# Patient Record
Sex: Female | Born: 1990 | Race: Black or African American | Hispanic: No | Marital: Single | Smoking: Former smoker
Health system: Southern US, Community
[De-identification: ages and names within clinical notes are randomized; demographics above are authoritative.]

## PROBLEM LIST (undated history)

## (undated) DIAGNOSIS — I10 Essential (primary) hypertension: Secondary | ICD-10-CM

## (undated) HISTORY — PX: NO PAST SURGERIES: SHX2092

---

## 2010-04-29 ENCOUNTER — Emergency Department: Payer: Self-pay | Admitting: Unknown Physician Specialty

## 2010-11-22 ENCOUNTER — Observation Stay: Payer: Self-pay | Admitting: Obstetrics and Gynecology

## 2010-11-24 ENCOUNTER — Observation Stay: Payer: Self-pay

## 2010-11-26 ENCOUNTER — Inpatient Hospital Stay: Payer: Self-pay

## 2012-01-24 ENCOUNTER — Emergency Department: Payer: Self-pay | Admitting: *Deleted

## 2012-01-24 LAB — COMPREHENSIVE METABOLIC PANEL
Albumin: 4.2 g/dL (ref 3.4–5.0)
Co2: 24 mmol/L (ref 21–32)
Creatinine: 0.67 mg/dL (ref 0.60–1.30)
EGFR (Non-African Amer.): >60
Glucose: 95 mg/dL (ref 65–99)
SGOT(AST): 35 U/L (ref 15–37)

## 2012-01-24 LAB — CBC
HGB: 13.7 g/dL (ref 12.0–16.0)
MCHC: 33.9 g/dL (ref 32.0–36.0)
MCV: 94 fL (ref 80–100)
RBC: 4.31 10*6/uL (ref 3.80–5.20)

## 2012-01-24 LAB — URINALYSIS, COMPLETE
Bilirubin,UR: NEGATIVE
Blood: NEGATIVE
Nitrite: NEGATIVE
RBC,UR: 4 /HPF (ref 0–5)
Specific Gravity: 1.028 (ref 1.003–1.030)
Squamous Epithelial: 7
WBC UR: 3 /HPF (ref 0–5)

## 2012-01-24 LAB — LIPASE, BLOOD: Lipase: 110 U/L (ref 73–393)

## 2012-05-07 ENCOUNTER — Emergency Department: Payer: Self-pay | Admitting: *Deleted

## 2012-06-04 ENCOUNTER — Emergency Department: Payer: Self-pay | Admitting: Emergency Medicine

## 2012-06-08 LAB — BETA STREP CULTURE(ARMC)

## 2013-05-20 ENCOUNTER — Inpatient Hospital Stay (HOSPITAL_COMMUNITY)
Admission: AD | Admit: 2013-05-20 | Discharge: 2013-05-20 | Discharge status: Against Medical Advice | Payer: Self-pay | Source: Ambulatory Visit | Attending: Obstetrics and Gynecology | Admitting: Obstetrics and Gynecology

## 2013-05-20 NOTE — MAU Note (Signed)
Pt not in Lobby. 

## 2013-05-20 NOTE — MAU Note (Signed)
Pt not in lobby.  

## 2013-05-21 ENCOUNTER — Emergency Department: Payer: Self-pay | Admitting: Emergency Medicine

## 2013-05-21 LAB — URINALYSIS, COMPLETE
Bilirubin,UR: NEGATIVE
Ketone: NEGATIVE
Leukocyte Esterase: NEGATIVE
Ph: 5 (ref 4.5–8.0)
Protein: NEGATIVE
RBC,UR: 150 /HPF (ref 0–5)
Squamous Epithelial: 1
WBC UR: NONE SEEN /HPF (ref 0–5)

## 2013-05-21 LAB — CBC
HCT: 36.4 % (ref 35.0–47.0)
HGB: 12.3 g/dL (ref 12.0–16.0)
MCH: 31.2 pg (ref 26.0–34.0)
MCV: 92 fL (ref 80–100)
RBC: 3.96 10*6/uL (ref 3.80–5.20)
RDW: 13.8 % (ref 11.5–14.5)
WBC: 3.5 10*3/uL — ABNORMAL LOW (ref 3.6–11.0)

## 2013-05-21 LAB — COMPREHENSIVE METABOLIC PANEL
Albumin: 3.3 g/dL — ABNORMAL LOW (ref 3.4–5.0)
BUN: 12 mg/dL (ref 7–18)
Calcium, Total: 8.7 mg/dL (ref 8.5–10.1)
Chloride: 110 mmol/L — ABNORMAL HIGH (ref 98–107)
Co2: 25 mmol/L (ref 21–32)
Creatinine: 0.63 mg/dL (ref 0.60–1.30)
EGFR (African American): >60
EGFR (Non-African Amer.): >60
Osmolality: 275 (ref 275–301)
Potassium: 4 mmol/L (ref 3.5–5.1)
SGOT(AST): 19 U/L (ref 15–37)
SGPT (ALT): 20 U/L (ref 12–78)
Sodium: 138 mmol/L (ref 136–145)

## 2013-05-21 LAB — HCG, QUANTITATIVE, PREGNANCY: Beta Hcg, Quant.: <1 m[IU]/mL — ABNORMAL LOW

## 2013-05-21 LAB — GC/CHLAMYDIA PROBE AMP

## 2013-06-01 IMAGING — CT CT HEAD WITHOUT CONTRAST
2 series · 16 of 30 positions shown, 20 images · non-contrast
Comparison: none

REASON FOR EXAM: worst HA of life
COMMENTS:   LMP: One week ago

[Series 2: without · axial · non-contrast · 0.42mm/px · z∈[+306,+430]mm · 13 of 31 slices shown, 17 images]
[im 3/31  brain]
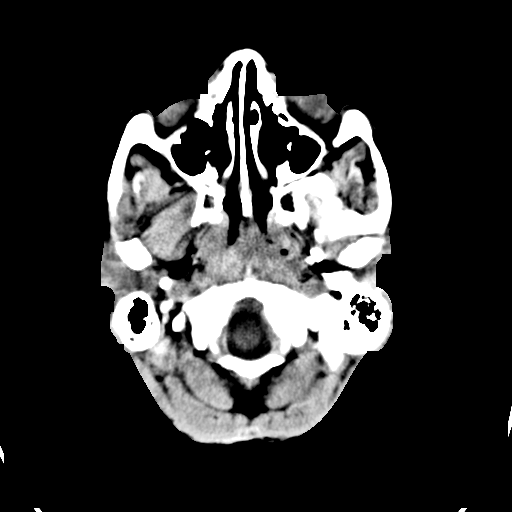
[im 3/31  bone]
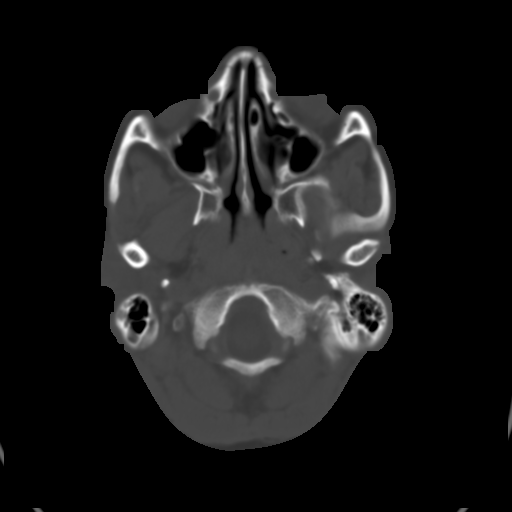
[im 5/31  brain]
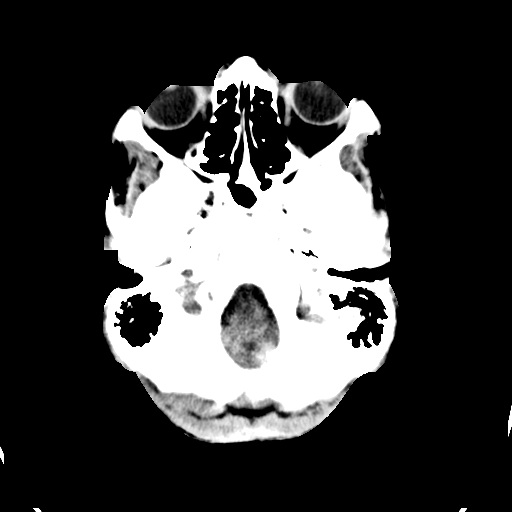
[im 7/31  brain]
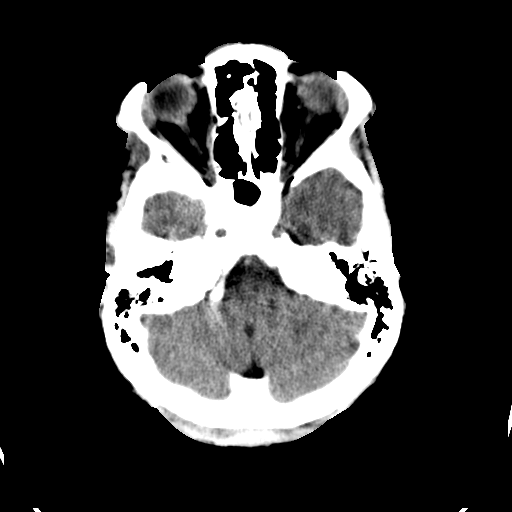
[im 9/31  brain]
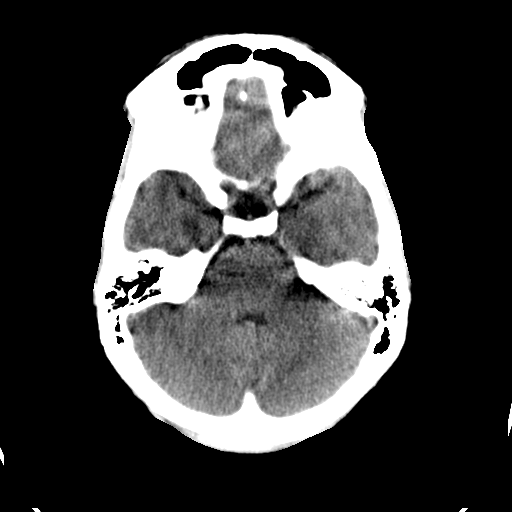
[im 11/31  brain]
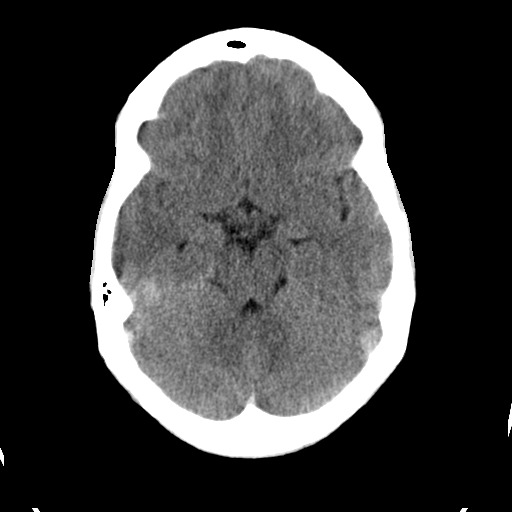
[im 11/31  bone]
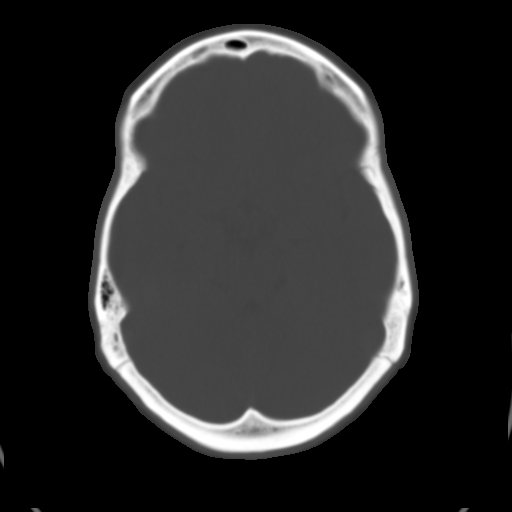
[im 13/31  brain]
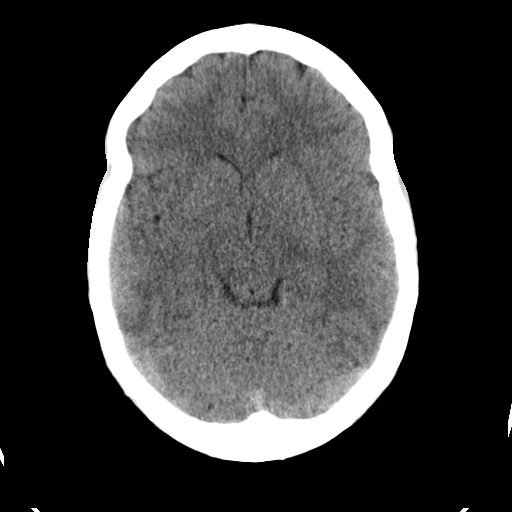
[im 16/31  brain]
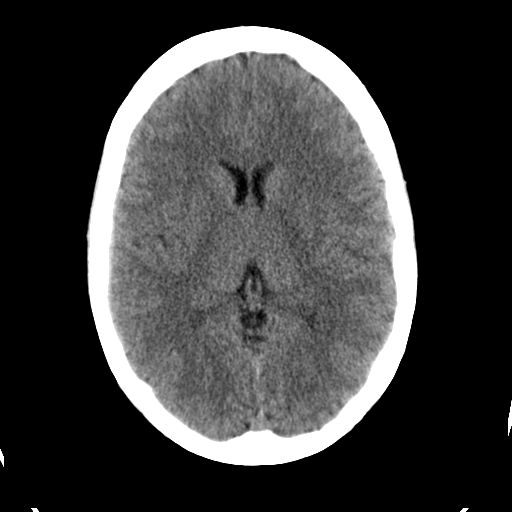
[im 18/31  brain]
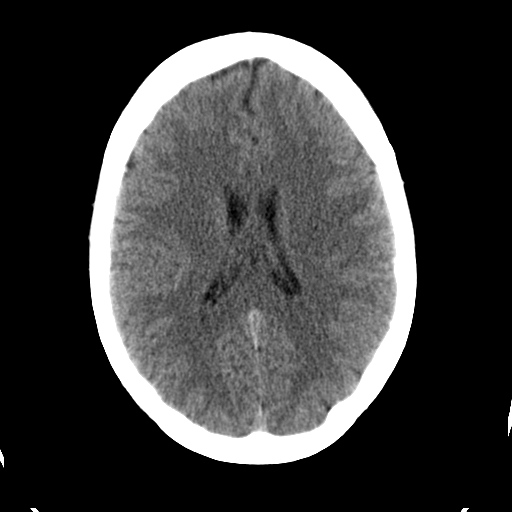
[im 20/31  brain]
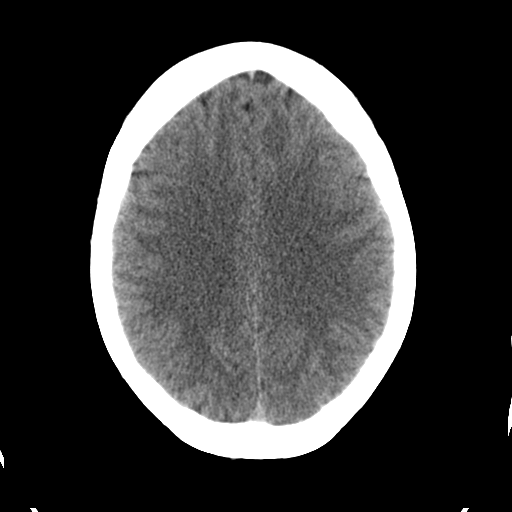
[im 20/31  bone]
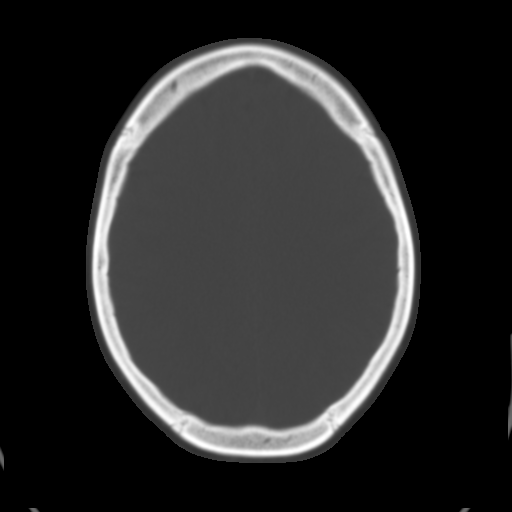
[im 22/31  brain]
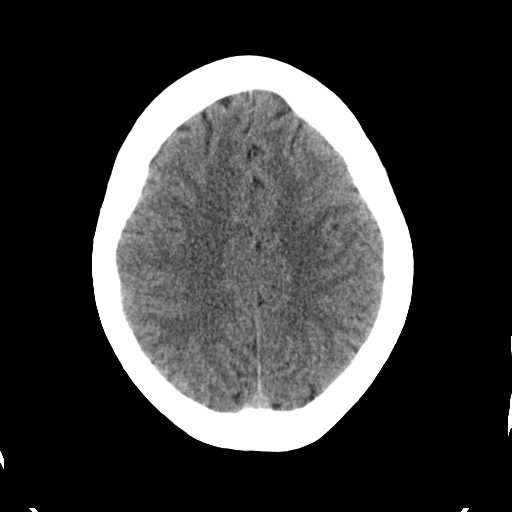
[im 24/31  brain]
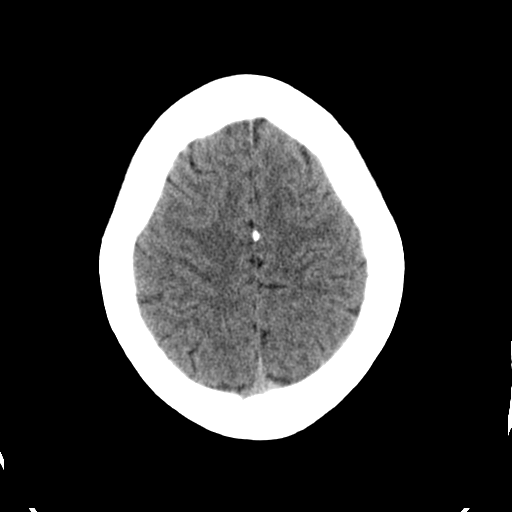
[im 26/31  brain]
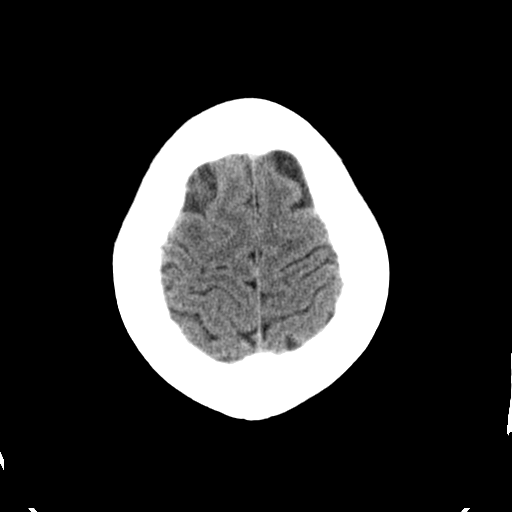
[im 28/31  brain]
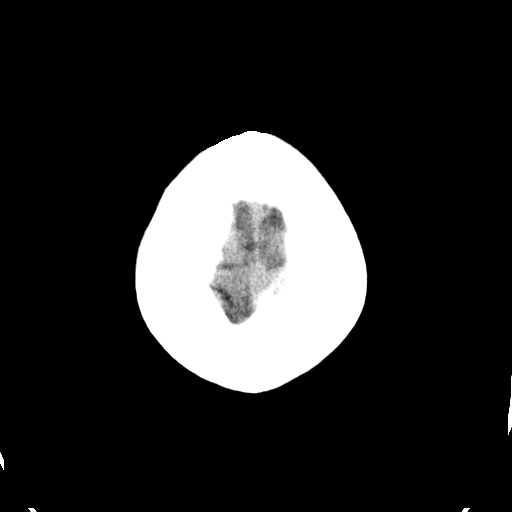
[im 28/31  bone]
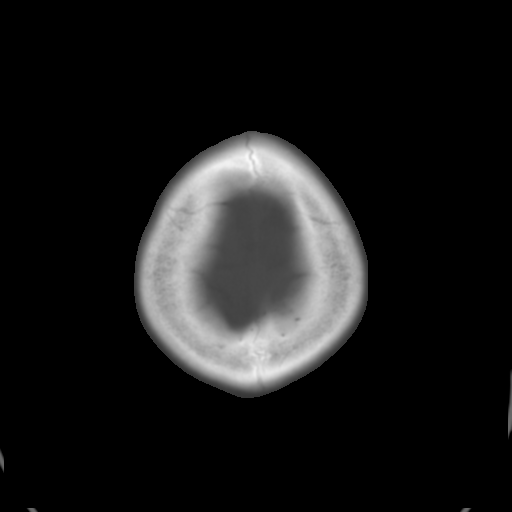

[Series 3: bone · axial · 0.42mm/px · z∈[+306,+346]mm · 3 of 31 slices shown]
[im 3/31  bone]
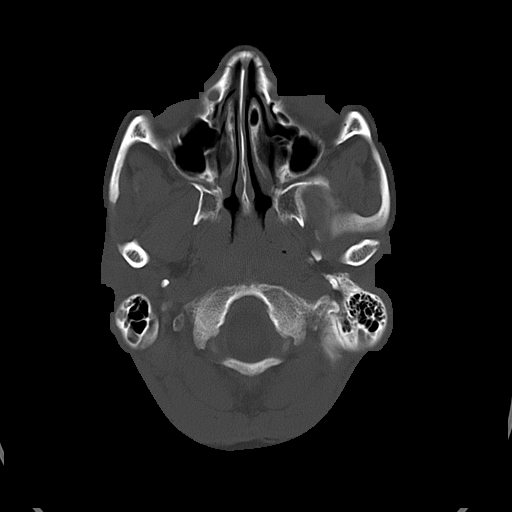
[im 7/31  bone]
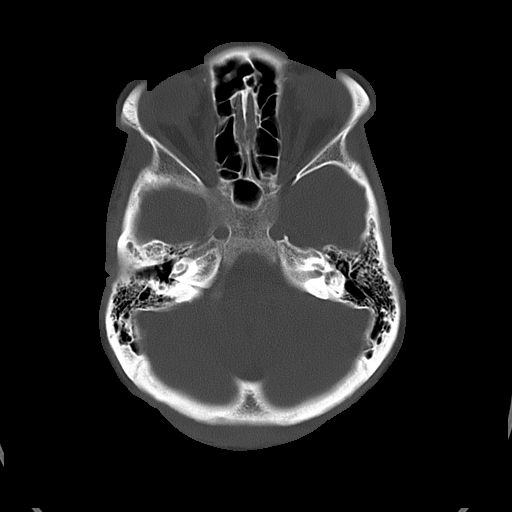
[im 11/31  bone]
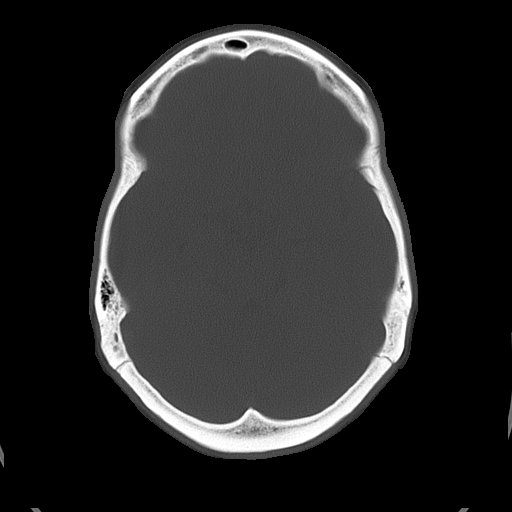

[16 of 30 positions shown; findings below may reference images not displayed]

PROCEDURE:     CT  - CT HEAD WITHOUT CONTRAST  - June 04, 2012  [DATE]

RESULT:     Axial CT scanning was performed through the brain with
reconstructions at 5 mm intervals and slice thicknesses.

The ventricles are normal in size and position. There is no intracranial
hemorrhage nor intracranial mass effect. The cerebellum and brainstem are
normal in density. There are no abnormal intracranial calcifications. There
is no evidence of an acute ischemic or hemorrhagic event. At bone window
settings there is no evidence of an acute skull fracture.
IMPRESSION: There is no acute intracranial abnormality.

A preliminary report was sent to the [HOSPITAL] the conclusion
of the study.

## 2013-11-15 ENCOUNTER — Emergency Department: Payer: Self-pay | Admitting: Emergency Medicine

## 2013-11-15 LAB — CBC
HCT: 39.6 % (ref 35.0–47.0)
HGB: 13.4 g/dL (ref 12.0–16.0)
MCH: 31.6 pg (ref 26.0–34.0)
MCHC: 33.9 g/dL (ref 32.0–36.0)
MCV: 93 fL (ref 80–100)
Platelet: 270 10*3/uL (ref 150–440)
RBC: 4.25 10*6/uL (ref 3.80–5.20)
RDW: 13.3 % (ref 11.5–14.5)
WBC: 5.7 10*3/uL (ref 3.6–11.0)

## 2013-11-15 LAB — COMPREHENSIVE METABOLIC PANEL
ANION GAP: 6 — AB (ref 7–16)
Albumin: 3.7 g/dL (ref 3.4–5.0)
Alkaline Phosphatase: 107 U/L
BUN: 11 mg/dL (ref 7–18)
Bilirubin,Total: 0.9 mg/dL (ref 0.2–1.0)
CREATININE: 0.62 mg/dL (ref 0.60–1.30)
Calcium, Total: 9.1 mg/dL (ref 8.5–10.1)
Chloride: 109 mmol/L — ABNORMAL HIGH (ref 98–107)
Co2: 23 mmol/L (ref 21–32)
EGFR (African American): >60
GLUCOSE: 92 mg/dL (ref 65–99)
OSMOLALITY: 275 (ref 275–301)
Potassium: 3.9 mmol/L (ref 3.5–5.1)
SGOT(AST): 20 U/L (ref 15–37)
SGPT (ALT): 31 U/L (ref 12–78)
Sodium: 138 mmol/L (ref 136–145)
Total Protein: 7.6 g/dL (ref 6.4–8.2)

## 2013-11-15 LAB — URINALYSIS, COMPLETE
BILIRUBIN, UR: NEGATIVE
Bacteria: NONE SEEN
GLUCOSE, UR: NEGATIVE mg/dL (ref 0–75)
Ketone: NEGATIVE
LEUKOCYTE ESTERASE: NEGATIVE
Nitrite: NEGATIVE
Ph: 5 (ref 4.5–8.0)
Protein: NEGATIVE
RBC,UR: 4 /HPF (ref 0–5)
Specific Gravity: 1.023 (ref 1.003–1.030)
Squamous Epithelial: 4
WBC UR: 2 /HPF (ref 0–5)

## 2013-11-15 LAB — PREGNANCY, URINE: Pregnancy Test, Urine: NEGATIVE m[IU]/mL

## 2013-11-15 LAB — LIPASE, BLOOD: Lipase: 91 U/L (ref 73–393)

## 2014-03-09 ENCOUNTER — Emergency Department: Payer: Self-pay | Admitting: Emergency Medicine

## 2017-10-16 ENCOUNTER — Emergency Department
Admission: EM | Admit: 2017-10-16 | Discharge: 2017-10-16 | Disposition: A | Discharge status: Home / Self Care | Payer: Self-pay | Attending: Emergency Medicine | Admitting: Emergency Medicine

## 2017-10-16 ENCOUNTER — Encounter: Payer: Self-pay | Admitting: Emergency Medicine

## 2017-10-16 DIAGNOSIS — L02412 Cutaneous abscess of left axilla: Secondary | ICD-10-CM | POA: Insufficient documentation

## 2017-10-16 DIAGNOSIS — F17200 Nicotine dependence, unspecified, uncomplicated: Secondary | ICD-10-CM | POA: Insufficient documentation

## 2017-10-16 MED ORDER — LIDOCAINE HCL (PF) 1 % IJ SOLN: 5.0000 mL | Freq: Once | INTRAMUSCULAR | Status: DC

## 2017-10-16 MED ORDER — HYDROCODONE-ACETAMINOPHEN 5-325 MG PO TABS: 1.0000 | ORAL_TABLET | Freq: Four times a day (QID) | ORAL | 0 refills | Status: DC | PRN

## 2017-10-16 MED ORDER — SULFAMETHOXAZOLE-TRIMETHOPRIM 800-160 MG PO TABS: 1.0000 | ORAL_TABLET | Freq: Two times a day (BID) | ORAL | 0 refills | Status: DC

## 2017-10-16 NOTE — ED Provider Notes (Signed)
[facility] Emergency Department Provider Note  ____________________________________________   First MD Initiated Contact with Patient 10/16/17 1005     (approximate)  I have reviewed the triage vital signs and the nursing notes.   HISTORY  Chief Complaint Abscess    HPI Terri Piedratasia T Albertsen is a 26 y.o. female complains of having a swollen red area on the left axilla, she denies fever or chills, she states it has been there for 2 days, she states it got a lot "bigger "overnight, she has never had a abscess before  History reviewed. No pertinent past medical history.  There are no active problems to display for this patient.   History reviewed. No pertinent surgical history.  Prior to Admission medications   Medication Sig Start Date End Date Taking? Authorizing Provider  HYDROcodone-acetaminophen (NORCO/VICODIN) 5-325 MG tablet Take 1 tablet by mouth every 6 (six) hours as needed for moderate pain. 10/16/17   Tiffanyann Deroo, Roselyn BeringSusan W, PA-C  sulfamethoxazole-trimethoprim (BACTRIM DS,SEPTRA DS) 800-160 MG tablet Take 1 tablet by mouth 2 (two) times daily. 10/16/17   Faythe GheeFisher, Arrow Tomko W, PA-C    Allergies Patient has no known allergies.  No family history on file.  Social History Social History   Tobacco Use  . Smoking status: Light Tobacco Smoker  . Smokeless tobacco: Never Used  Substance Use Topics  . Alcohol use: Not on file  . Drug use: No    Review of Systems  Constitutional: No fever/chills Eyes: No visual changes. ENT: No sore throat. Respiratory: Denies cough Genitourinary: Negative for dysuria. Musculoskeletal: Negative for back pain. Skin: Positive for red swollen area    ____________________________________________   PHYSICAL EXAM:  VITAL SIGNS: ED Triage Vitals  Enc Vitals Group     BP 10/16/17 0952 (!) 177/77     Pulse Rate 10/16/17 0952 85     Resp 10/16/17 0952 20     Temp 10/16/17 0952 98.8 F (37.1 C)     Temp Source  10/16/17 0952 Oral     SpO2 10/16/17 0952 97 %     Weight 10/16/17 0952 165 lb (74.8 kg)     Height 10/16/17 0952 5\' 5"  (1.651 m)     Head Circumference --      Peak Flow --      Pain Score 10/16/17 0949 6     Pain Loc --      Pain Edu? --      Excl. in GC? --     Constitutional: Alert and oriented. Well appearing and in no acute distress. Eyes: Conjunctivae are normal.  Head: Atraumatic. Nose: No congestion/rhinnorhea. Mouth/Throat: Mucous membranes are moist.   Cardiovascular: Normal rate, regular rhythm.  Heart sounds are normal Respiratory: Normal respiratory effort.  No retractions, lungs are clear to auscultation GU: deferred Musculoskeletal: FROM all extremities, warm and well perfused Neurologic:  Normal speech and language.  Skin:  Skin is warm, dry and intact.  Positive for a large red swollen fluctuant area in the left upper axilla Psychiatric: Mood and affect are normal. Speech and behavior are normal.  ____________________________________________   LABS (all labs ordered are listed, but only abnormal results are displayed)  Labs Reviewed - No data to display ____________________________________________   ____________________________________________  RADIOLOGY    ____________________________________________   PROCEDURES  Procedure(s) performed: I&D of the abscess Area was cleaned with Betadine, 1% Xylocaine was used as a local anesthetic, #11 blade used for incision, a large amount of pus was expelled, the area  was irrigated and then packed with iodoform, a dressing was applied, patient tolerated procedure well      ____________________________________________   INITIAL IMPRESSION / ASSESSMENT AND PLAN / ED COURSE  Pertinent labs & imaging results that were available during my care of the patient were reviewed by me and considered in my medical decision making (see chart for details).  Patient is a 26 year old female with complaints of a left  axillary abscess, she is afebrile, the area was I&D, she is to return to the emergency room in 2 days for a recheck and packing removal, she was given a prescription for Septra DS 1 twice daily for 7 days, Norco 5/325, patient was discharged in stable condition, she states she will comply with treatment plan      ____________________________________________   FINAL CLINICAL IMPRESSION(S) / ED DIAGNOSES  Final diagnoses:  Abscess of left axilla      NEW MEDICATIONS STARTED DURING THIS VISIT:  This SmartLink is deprecated. Use AVSMEDLIST instead to display the medication list for a patient.   Note:  This document was prepared using Dragon voice recognition software and may include unintentional dictation errors.    Faythe GheeFisher, Kylii Ennis W, PA-C 10/16/17 1106    Governor RooksLord, Rebecca, MD 10/16/17 [phone]

## 2017-10-16 NOTE — Discharge Instructions (Signed)
Return to the emergency room in 2 days to have the packing removed, you can shower but try to keep the area as dry as possible, if you develop a fever or if the area is worsening please return earlier than 2 days, take the antibiotic as prescribed, he can take ibuprofen for pain, pain that is not controlled by ibuprofen he can take the hydrocodone, do not take extra Tylenol if you are taking the hydrocodone as it already has Tylenol in it

## 2017-10-16 NOTE — ED Triage Notes (Signed)
Presents with possible abscess area under left arm

## 2017-10-19 ENCOUNTER — Emergency Department: Admission: EM | Admit: 2017-10-19 | Discharge: 2017-10-19 | Discharge status: Absent without leave | Payer: Self-pay

## 2017-10-19 NOTE — ED Notes (Signed)
Pt needs packing removed from left upper arm. NAD.

## 2018-09-09 ENCOUNTER — Encounter: Payer: Self-pay | Admitting: Emergency Medicine

## 2018-09-09 ENCOUNTER — Emergency Department
Admission: EM | Admit: 2018-09-09 | Discharge: 2018-09-09 | Discharge status: Against Medical Advice | Payer: Self-pay | Attending: Emergency Medicine | Admitting: Emergency Medicine

## 2018-09-09 ENCOUNTER — Other Ambulatory Visit: Payer: Self-pay

## 2018-09-09 DIAGNOSIS — R1030 Lower abdominal pain, unspecified: Secondary | ICD-10-CM | POA: Insufficient documentation

## 2018-09-09 DIAGNOSIS — Z5321 Procedure and treatment not carried out due to patient leaving prior to being seen by health care provider: Secondary | ICD-10-CM | POA: Insufficient documentation

## 2018-09-09 LAB — URINALYSIS, COMPLETE (UACMP) WITH MICROSCOPIC
Bacteria, UA: NONE SEEN
Bilirubin Urine: NEGATIVE
Glucose, UA: NEGATIVE mg/dL
Hgb urine dipstick: NEGATIVE
Ketones, ur: NEGATIVE mg/dL
Leukocytes, UA: NEGATIVE
Nitrite: NEGATIVE
PH: 7 (ref 5.0–8.0)
Protein, ur: NEGATIVE mg/dL
Specific Gravity, Urine: 1.024 (ref 1.005–1.030)

## 2018-09-09 LAB — COMPREHENSIVE METABOLIC PANEL
ALK PHOS: 102 U/L (ref 38–126)
ALT: 22 U/L (ref 0–44)
AST: 21 U/L (ref 15–41)
Albumin: 3.8 g/dL (ref 3.5–5.0)
Anion gap: 9 (ref 5–15)
BILIRUBIN TOTAL: 1 mg/dL (ref 0.3–1.2)
BUN: 12 mg/dL (ref 6–20)
CALCIUM: 8.8 mg/dL — AB (ref 8.9–10.3)
CO2: 23 mmol/L (ref 22–32)
CREATININE: 0.61 mg/dL (ref 0.44–1.00)
Chloride: 107 mmol/L (ref 98–111)
GFR calc non Af Amer: >60 mL/min (ref >60)
Glucose, Bld: 100 mg/dL — ABNORMAL HIGH (ref 70–99)
Potassium: 4 mmol/L (ref 3.5–5.1)
Sodium: 139 mmol/L (ref 135–145)
Total Protein: 7.4 g/dL (ref 6.5–8.1)

## 2018-09-09 LAB — CBC
HCT: 38.1 % (ref 36.0–46.0)
Hemoglobin: 12.6 g/dL (ref 12.0–15.0)
MCH: 30.6 pg (ref 26.0–34.0)
MCHC: 33.1 g/dL (ref 30.0–36.0)
MCV: 92.5 fL (ref 80.0–100.0)
PLATELETS: 359 10*3/uL (ref 150–400)
RBC: 4.12 MIL/uL (ref 3.87–5.11)
RDW: 13.3 % (ref 11.5–15.5)
WBC: 6.5 10*3/uL (ref 4.0–10.5)
nRBC: 0 % (ref 0.0–0.2)

## 2018-09-09 LAB — LIPASE, BLOOD: Lipase: 27 U/L (ref 11–51)

## 2018-09-09 LAB — POCT PREGNANCY, URINE: Preg Test, Ur: NEGATIVE

## 2018-09-09 NOTE — ED Provider Notes (Signed)
Went to evaluate patient in room 13, no patient or belongings present.  Apparently the patient has left without being seen.   Sharman CheekStafford, Jazara Swiney, MD 09/09/18 [phone]

## 2018-09-09 NOTE — ED Triage Notes (Signed)
Pt arrived with concerns over lower abdominal cramping/dismfort and intermittent diarrhea with blood in it. Pt provides a picture with bright red blood in a loose stool. Pt states she has had these problems for roughly 3 months but has recently been encouraged by her mom to seek medical attention.

## 2018-09-11 ENCOUNTER — Telehealth: Payer: Self-pay | Admitting: Emergency Medicine

## 2018-09-11 NOTE — Telephone Encounter (Signed)
Called patient due to lwot to inquire about condition and follow up plans. Left message.   

## 2018-11-17 ENCOUNTER — Ambulatory Visit: Payer: Self-pay | Admitting: Nurse Practitioner

## 2018-12-08 ENCOUNTER — Ambulatory Visit: Payer: Self-pay | Admitting: Nurse Practitioner

## 2020-10-07 ENCOUNTER — Other Ambulatory Visit (HOSPITAL_COMMUNITY)
Admission: RE | Admit: 2020-10-07 | Discharge: 2020-10-07 | Disposition: A | Discharge status: Home / Self Care | Payer: Self-pay | Source: Ambulatory Visit | Attending: Advanced Practice Midwife | Admitting: Advanced Practice Midwife

## 2020-10-07 ENCOUNTER — Encounter: Payer: Self-pay | Admitting: Advanced Practice Midwife

## 2020-10-07 ENCOUNTER — Ambulatory Visit (INDEPENDENT_AMBULATORY_CARE_PROVIDER_SITE_OTHER): Payer: Medicaid Other | Admitting: Advanced Practice Midwife

## 2020-10-07 ENCOUNTER — Other Ambulatory Visit: Payer: Self-pay

## 2020-10-07 VITALS — BP 118/78 | Wt 196.0 lb

## 2020-10-07 DIAGNOSIS — Z113 Encounter for screening for infections with a predominantly sexual mode of transmission: Secondary | ICD-10-CM | POA: Insufficient documentation

## 2020-10-07 DIAGNOSIS — Z3481 Encounter for supervision of other normal pregnancy, first trimester: Secondary | ICD-10-CM | POA: Insufficient documentation

## 2020-10-07 DIAGNOSIS — Z3A10 10 weeks gestation of pregnancy: Secondary | ICD-10-CM

## 2020-10-07 DIAGNOSIS — Z1379 Encounter for other screening for genetic and chromosomal anomalies: Secondary | ICD-10-CM

## 2020-10-07 DIAGNOSIS — Z124 Encounter for screening for malignant neoplasm of cervix: Secondary | ICD-10-CM | POA: Insufficient documentation

## 2020-10-07 DIAGNOSIS — O99211 Obesity complicating pregnancy, first trimester: Secondary | ICD-10-CM

## 2020-10-07 DIAGNOSIS — N912 Amenorrhea, unspecified: Secondary | ICD-10-CM | POA: Diagnosis not present

## 2020-10-07 DIAGNOSIS — Z3046 Encounter for surveillance of implantable subdermal contraceptive: Secondary | ICD-10-CM

## 2020-10-07 LAB — POCT URINALYSIS DIPSTICK OB
Glucose, UA: NEGATIVE
POC,PROTEIN,UA: NEGATIVE

## 2020-10-07 LAB — POCT URINE PREGNANCY: Preg Test, Ur: POSITIVE — AB

## 2020-10-07 NOTE — Patient Instructions (Addendum)
1. Log onto website: [email or web address] 2. Review the following: a.  b.  c.     Breastfeeding Class Registration  1. Log onto website: [email or web address] 2. Under "Your Well-Being", click on: "Classes and Support Groups" 3. Scroll down and click on: "Breastfeeding" 4. Select the desired date from the calendar and register   Exercise During Pregnancy Exercise is an important part of being healthy for people of all ages. Exercise improves the function of your heart and lungs and helps you maintain strength, flexibility, and a healthy body weight. Exercise also boosts energy levels and elevates mood. Most women should exercise regularly during pregnancy. In rare cases, women with certain medical conditions or complications may be asked to limit or avoid exercise during pregnancy. How does this affect me? Along with maintaining general strength and flexibility, exercising during pregnancy can help:  Keep strength in muscles that are used during labor and childbirth.  Decrease low back pain.  Reduce symptoms of depression.  Control weight gain during pregnancy.  Reduce the risk of needing insulin if you develop diabetes during pregnancy.  Decrease the risk of cesarean delivery.  Speed up your recovery after giving birth. How does this affect my baby? Exercise can help you have a healthy pregnancy. Exercise does not cause premature birth. It will not cause your baby to weigh less at birth. What exercises can I do? Many exercises are safe for you to do during pregnancy. Do a variety of exercises that safely increase your heart and breathing rates and help you build and maintain muscle strength. Do exercises exactly as told by your health care provider. You may do these exercises:  Walking or hiking.  Swimming.  Water aerobics.  Riding a stationary bike.  Strength training.  Modified yoga or Pilates. Tell your instructor that you are pregnant. Avoid  overstretching, and avoid lying on your back for long periods of time.  Running or jogging. Only choose this type of exercise if you: ? Ran or jogged regularly before your pregnancy. ? Can run or jog and still talk in complete sentences. What exercises should I avoid? Depending on your level of fitness and whether you exercised regularly before your pregnancy, you may be told to limit high-intensity exercise. You can tell that you are exercising at a high intensity if you are breathing much harder and faster and cannot hold a conversation while exercising. You must avoid:  Contact sports.  Activities that put you at risk for falling on or being hit in the belly, such as downhill skiing, water skiing, surfing, rock climbing, cycling, gymnastics, and horseback riding.  Scuba diving.  Skydiving.  Yoga or Pilates in a room that is heated to high temperatures.  Jogging or running, unless you ran or jogged regularly before your pregnancy. While jogging or running, you should always be able to talk in full sentences. Do not run or jog so fast that you are unable to have a conversation.  Do not exercise at more than 6,000 feet above sea level (high elevation) if you are not used to exercising at high elevation. How do I exercise in a safe way?   Avoid overheating. Do not exercise in very high temperatures.  Wear loose-fitting, breathable clothes.  Avoid dehydration. Drink enough water before, during, and after exercise to keep your urine pale yellow.  Avoid overstretching. Because of hormone changes during pregnancy, it is easy to overstretch muscles, tendons, and ligaments during pregnancy.  Start slowly and ask your health care provider  to recommend the types of exercise that are safe for you.  Do not exercise to lose weight. Follow these instructions at home:  Exercise on most days or all days of the week. Try to exercise for 30 minutes a day, 5 days a week, unless your health care  provider tells you not to.  If you actively exercised before your pregnancy and you are healthy, your health care provider may tell you to continue to do moderate to high-intensity exercise.  If you are just starting to exercise or did not exercise much before your pregnancy, your health care provider may tell you to do low to moderate-intensity exercise. Questions to ask your health care provider  Is exercise safe for me?  What are signs that I should stop exercising?  Does my health condition mean that I should not exercise during pregnancy?  When should I avoid exercising during pregnancy? Stop exercising and contact a health care provider if: You have any unusual symptoms, such as:  Mild contractions of the uterus or cramps in the abdomen.  Dizziness that does not go away when you rest. Stop exercising and get help right away if: You have any unusual symptoms, such as:  Sudden, severe pain in your low back or your belly.  Mild contractions of the uterus or cramps in the abdomen that do not improve with rest and drinking fluids.  Chest pain.  Bleeding or fluid leaking from your vagina.  Shortness of breath. These symptoms may represent a serious problem that is an emergency. Do not wait to see if the symptoms will go away. Get medical help right away. Call your local emergency services (911 in the [place] Do not drive yourself to the hospital. Summary  Most women should exercise regularly throughout pregnancy. In rare cases, women with certain medical conditions or complications may be asked to limit or avoid exercise during pregnancy.  Do not exercise to lose weight during pregnancy.  Your health care provider will tell you what level of physical activity is right for you.  Stop exercising and contact a health care provider if you have mild contractions of the uterus or cramps in the abdomen. Get help right away if these contractions or cramps do not improve with rest and  drinking fluids.  Stop exercising and get help right away if you have sudden, severe pain in your low back or belly, chest pain, shortness of breath, or bleeding or leaking of fluid from your vagina. This information is not intended to replace advice given to you by your health care provider. Make sure you discuss any questions you have with your health care provider. Document Revised: 02/01/2019 Document Reviewed: 11/15/2018 Elsevier Patient Education  2020 [employer]. Eating Plan for Pregnant Women While you are pregnant, your body requires additional nutrition to help support your growing baby. You also have a higher need for some vitamins and minerals, such as folic acid, calcium, iron, and vitamin D. Eating a healthy, well-balanced diet is very important for your health and your baby's health. Your need for extra calories varies for the three 64-month segments of your pregnancy (trimesters). For most women, it is recommended to consume:  150 extra calories a day during the first trimester.  300 extra calories a day during the second trimester.  300 extra calories a day during the third trimester. What are tips for following this plan?   Do not try to lose weight or go on a diet during pregnancy.  Limit your  overall intake of foods that have "empty calories." These are foods that have little nutritional value, such as sweets, desserts, candies, and sugar-sweetened beverages.  Eat a variety of foods (especially fruits and vegetables) to get a full range of vitamins and minerals.  Take a prenatal vitamin to help meet your additional vitamin and mineral needs during pregnancy, specifically for folic acid, iron, calcium, and vitamin D.  Remember to stay active. Ask your health care provider what types of exercise and activities are safe for you.  Practice good food safety and cleanliness. Wash your hands before you eat and after you prepare raw meat. Wash all fruits and vegetables well  before peeling or eating. Taking these actions can help to prevent food-borne illnesses that can be very dangerous to your baby, such as listeriosis. Ask your health care provider for more information about listeriosis. What does 150 extra calories look like? Healthy options that provide 150 extra calories each day could be any of the following:  6-8 oz (170-230 g) of plain low-fat yogurt with  cup of berries.  1 apple with 2 teaspoons (11 g) of peanut butter.  Cut-up vegetables with  cup (60 g) of hummus.  8 oz (230 mL) or 1 cup of low-fat chocolate milk.  1 stick of string cheese with 1 medium orange.  1 peanut butter and jelly sandwich that is made with one slice of whole-wheat bread and 1 tsp (5 g) of peanut butter. For 300 extra calories, you could eat two of those healthy options each day. What is a healthy amount of weight to gain? The right amount of weight gain for you is based on your BMI before you became pregnant. If your BMI:  Was less than 18 (underweight), you should gain 28-40 lb (13-18 kg).  Was 18-24.9 (normal), you should gain 25-35 lb (11-16 kg).  Was 25-29.9 (overweight), you should gain 15-25 lb (7-11 kg).  Was 30 or greater (obese), you should gain 11-20 lb (5-9 kg). What if I am having twins or multiples? Generally, if you are carrying twins or multiples:  You may need to eat 300-600 extra calories a day.  The recommended range for total weight gain is 25-54 lb (11-25 kg), depending on your BMI before pregnancy.  Talk with your health care provider to find out about nutritional needs, weight gain, and exercise that is right for you. What foods can I eat?  Fruits All fruits. Eat a variety of colors and types of fruit. Remember to wash your fruits well before peeling or eating. Vegetables All vegetables. Eat a variety of colors and types of vegetables. Remember to wash your vegetables well before peeling or eating. Grains All grains. Choose whole  grains, such as whole-wheat bread, oatmeal, or brown rice. Meats and other protein foods Lean meats, including chicken, [place], fish, and lean cuts of beef, veal, or pork. If you eat fish or seafood, choose options that are higher in omega-3 fatty acids and lower in mercury, such as salmon, herring, mussels, trout, sardines, pollock, shrimp, crab, and lobster. Tofu. Tempeh. Beans. Eggs. Peanut butter and other nut butters. Make sure that all meats, poultry, and eggs are cooked to food-safe temperatures or "well-done." Two or more servings of fish are recommended each week in order to get the most benefits from omega-3 fatty acids that are found in seafood. Choose fish that are lower in mercury. You can find more information online:  PumpkinSearch.com.eewww.fda.gov Dairy Pasteurized milk and milk alternatives (such as almond  milk). Pasteurized yogurt and pasteurized cheese. Cottage cheese. Sour cream. Beverages Water. Juices that contain 100% fruit juice or vegetable juice. Caffeine-free teas and decaffeinated coffee. Drinks that contain caffeine are okay to drink, but it is better to avoid caffeine. Keep your total caffeine intake to less than 200 mg each day (which is 12 oz or 355 mL of coffee, tea, or soda) or the limit as told by your health care provider. Fats and oils Fats and oils are okay to include in moderation. Sweets and desserts Sweets and desserts are okay to include in moderation. Seasoning and other foods All pasteurized condiments. The items listed above may not be a complete list of foods and beverages you can eat. Contact a dietitian for more information. What foods are not recommended? Fruits Unpasteurized fruit juices. Vegetables Raw (unpasteurized) vegetable juices. Meats and other protein foods Lunch meats, bologna, hot dogs, or other deli meats. (If you must eat those meats, reheat them until they are steaming hot.) Refrigerated pat, meat spreads from a meat counter, smoked seafood that  is found in the refrigerated section of a store. Raw or undercooked meats, poultry, and eggs. Raw fish, such as sushi or sashimi. Fish that have high mercury content, such as tilefish, shark, swordfish, and king mackerel. To learn more about mercury in fish, talk with your health care provider or look for online resources, such as:  PumpkinSearch.com.ee Dairy Raw (unpasteurized) milk and any foods that have raw milk in them. Soft cheeses, such as feta, queso blanco, queso fresco, Brie, Camembert cheeses, blue-veined cheeses, and Panela cheese (unless it is made with pasteurized milk, which must be stated on the label). Beverages Alcohol. Sugar-sweetened beverages, such as sodas, teas, or energy drinks. Seasoning and other foods Homemade fermented foods and drinks, such as pickles, sauerkraut, or kombucha drinks. (Store-bought pasteurized versions of these are okay.) Salads that are made in a store or deli, such as ham salad, chicken salad, egg salad, tuna salad, and seafood salad. The items listed above may not be a complete list of foods and beverages you should avoid. Contact a dietitian for more information. Where to find more information To calculate the number of calories you need based on your height, weight, and activity level, you can use an online calculator such as:  PackageNews.is To calculate how much weight you should gain during pregnancy, you can use an online pregnancy weight gain calculator such as:  http://jones-berg.com/ Summary  While you are pregnant, your body requires additional nutrition to help support your growing baby.  Eat a variety of foods, especially fruits and vegetables to get a full range of vitamins and minerals.  Practice good food safety and cleanliness. Wash your hands before you eat and after you prepare raw meat. Wash all fruits and vegetables well before peeling or eating. Taking these actions can help to  prevent food-borne illnesses, such as listeriosis, that can be very dangerous to your baby.  Do not eat raw meat or fish. Do not eat fish that have high mercury content, such as tilefish, shark, swordfish, and king mackerel. Do not eat unpasteurized (raw) dairy.  Take a prenatal vitamin to help meet your additional vitamin and mineral needs during pregnancy, specifically for folic acid, iron, calcium, and vitamin D. This information is not intended to replace advice given to you by your health care provider. Make sure you discuss any questions you have with your health care provider. Document Revised: 03/01/2019 Document Reviewed: 07/08/2017 Elsevier Patient Education  [phone]  [employer] Prenatal Care Prenatal care is health care during pregnancy. It helps you and your unborn baby (fetus) stay as healthy as possible. Prenatal care may be provided by a midwife, a family practice health care provider, or a childbirth and pregnancy specialist (obstetrician). How does this affect me? During pregnancy, you will be closely monitored for any new conditions that might develop. To lower your risk of pregnancy complications, you and your health care provider will talk about any underlying conditions you have. How does this affect my baby? Early and consistent prenatal care increases the chance that your baby will be healthy during pregnancy. Prenatal care lowers the risk that your baby will be:  Born early (prematurely).  Smaller than expected at birth (small for gestational age). What can I expect at the first prenatal care visit? Your first prenatal care visit will likely be the longest. You should schedule your first prenatal care visit as soon as you know that you are pregnant. Your first visit is a good time to talk about any questions or concerns you have about pregnancy. At your visit, you and your health care provider will talk about:  Your medical history, including: ? Any past  pregnancies. ? Your family's medical history. ? The baby's father's medical history. ? Any long-term (chronic) health conditions you have and how you manage them. ? Any surgeries or procedures you have had. ? Any current over-the-counter or prescription medicines, herbs, or supplements you are taking.  Other factors that could pose a risk to your baby, including:  Your home setting and your stress levels, including: ? Exposure to abuse or violence. ? Household financial strain. ? Mental health conditions you have.  Your daily health habits, including diet and exercise. Your health care provider will also:  Measure your weight, height, and blood pressure.  Do a physical exam, including a pelvic and breast exam.  Perform blood tests and urine tests to check for: ? Urinary tract infection. ? Sexually transmitted infections (STIs). ? Low iron levels in your blood (anemia). ? Blood type and certain proteins on red blood cells (Rh antibodies). ? Infections and immunity to viruses, such as hepatitis B and rubella. ? HIV (human immunodeficiency virus).  Do an ultrasound to confirm your baby's growth and development and to help predict your estimated due date (EDD). This ultrasound is done with a probe that is inserted into the vagina (transvaginal ultrasound).  Discuss your options for genetic screening.  Give you information about how to keep yourself and your baby healthy, including: ? Nutrition and taking vitamins. ? Physical activity. ? How to manage pregnancy symptoms such as nausea and vomiting (morning sickness). ? Infections and substances that may be harmful to your baby and how to avoid them. ? Food safety. ? Dental care. ? Working. ? Travel. ? Warning signs to watch for and when to call your health care provider. How often will I have prenatal care visits? After your first prenatal care visit, you will have regular visits throughout your pregnancy. The visit schedule is  often as follows:  Up to week 28 of pregnancy: once every 4 weeks.  28-36 weeks: once every 2 weeks.  After 36 weeks: every week until delivery. Some women may have visits more or less often depending on any underlying health conditions and the health of the baby. Keep all follow-up and prenatal care visits as told by your health care provider. This is important. What happens during routine prenatal care visits? Your health  care provider will:  Measure your weight and blood pressure.  Check for fetal heart sounds.  Measure the height of your uterus in your abdomen (fundal height). This may be measured starting around week 20 of pregnancy.  Check the position of your baby inside your uterus.  Ask questions about your diet, sleeping patterns, and whether you can feel the baby move.  Review warning signs to watch for and signs of labor.  Ask about any pregnancy symptoms you are having and how you are dealing with them. Symptoms may include: ? Headaches. ? Nausea and vomiting. ? Vaginal discharge. ? Swelling. ? Fatigue. ? Constipation. ? Any discomfort, including back or pelvic pain. Make a list of questions to ask your health care provider at your routine visits. What tests might I have during prenatal care visits? You may have blood, urine, and imaging tests throughout your pregnancy, such as:  Urine tests to check for glucose, protein, or signs of infection.  Glucose tests to check for a form of diabetes that can develop during pregnancy (gestational diabetes mellitus). This is usually done around week 24 of pregnancy.  An ultrasound to check your baby's growth and development and to check for birth defects. This is usually done around week 20 of pregnancy.  A test to check for group B strep (GBS) infection. This is usually done around week 36 of pregnancy.  Genetic testing. This may include blood or imaging tests, such as an ultrasound. Some genetic tests are done during  the first trimester and some are done during the second trimester. What else can I expect during prenatal care visits? Your health care provider may recommend getting certain vaccines during pregnancy. These may include:  A yearly flu shot (annual influenza vaccine). This is especially important if you will be pregnant during flu season.  Tdap (tetanus, diphtheria, pertussis) vaccine. Getting this vaccine during pregnancy can protect your baby from whooping cough (pertussis) after birth. This vaccine may be recommended between weeks 27 and 36 of pregnancy. Later in your pregnancy, your health care provider may give you information about:  Childbirth and breastfeeding classes.  Choosing a health care provider for your baby.  Umbilical cord banking.  Breastfeeding.  Birth control after your baby is born.  The hospital labor and delivery unit and how to tour it.  Registering at the hospital before you go into labor. Where to find more information  Office on Women's Health: TravelLesson.ca  American Pregnancy Association: americanpregnancy.org  March of Dimes: marchofdimes.org Summary  Prenatal care helps you and your baby stay as healthy as possible during pregnancy.  Your first prenatal care visit will most likely be the longest.  You will have visits and tests throughout your pregnancy to monitor your health and your baby's health.  Bring a list of questions to your visits to ask your health care provider.  Make sure to keep all follow-up and prenatal care visits with your health care provider. This information is not intended to replace advice given to you by your health care provider. Make sure you discuss any questions you have with your health care provider. Document Revised: 01/31/2019 Document Reviewed: 10/10/2017 Elsevier Patient Education  2020 [employer].

## 2020-10-07 NOTE — Progress Notes (Signed)
NOB - wants nexplanon removed today. RM 4

## 2020-10-08 ENCOUNTER — Encounter: Payer: Self-pay | Admitting: Advanced Practice Midwife

## 2020-10-08 DIAGNOSIS — O0990 Supervision of high risk pregnancy, unspecified, unspecified trimester: Secondary | ICD-10-CM | POA: Insufficient documentation

## 2020-10-08 DIAGNOSIS — O99211 Obesity complicating pregnancy, first trimester: Secondary | ICD-10-CM | POA: Insufficient documentation

## 2020-10-08 NOTE — Progress Notes (Addendum)
New Obstetric Patient H&P  Date of Service: 10/07/2020  Chief Complaint: "Desires prenatal care"   History of Present Illness: Patient is a 29 y.o. [id number] Not Hispanic or Latino female, presents with amenorrhea and positive home pregnancy test. Patient's last menstrual period was 07/27/2020. and based on her  LMP, her EDD is Estimated Date of Delivery: 05/03/21 and her EGA is [redacted]w[redacted]d. Cycles are 3-7 days, regular, and occur approximately every : 28 days. Her last pap smear was within the past few years years ago and was no abnormalities.    She had a urine pregnancy test which was positive 3 week(s)  ago. Her last menstrual period was normal and lasted for  4 or 5 day(s). Since her LMP she claims she has experienced breast tenderness, fatigue, nausea. She denies vaginal bleeding. Her past medical history is noncontributory. Her prior pregnancies are notable for small for gestational age babies per patient report- G1 5# at 59 weeks female 2010 SVD, G2 6#? at 22 weeks female 2012 SVD  Since her LMP, she admits to the use of tobacco products  no She claims she has gained  4-5 pounds since the start of her pregnancy.  There are cats in the home in the home  no  She admits close contact with children on a regular basis  yes  She has had chicken pox in the past yes She has had Tuberculosis exposures, symptoms, or previously tested positive for TB   no Current or past history of domestic violence. no  Genetic Screening/Teratology Counseling: (Includes patient, baby's father, or anyone in either family with:)   1. Patient's age >/= 55 [facility]  no 2. Thalassemia ([place], [place], Mediterranean, or Asian background): MCV<80  no 3. Neural tube defect (meningomyelocele, spina bifida, anencephaly)  no 4. Congenital heart defect  no  5. Down syndrome  no 6. Tay-Sachs (Jewish, [place]  no 7. Canavan's Disease  no 8. Sickle cell disease or trait (African)  no  9. Hemophilia or other blood  disorders  no  10. Muscular dystrophy  no  11. Cystic fibrosis  no  12. Huntington's Chorea  no  13. Mental retardation/autism  no 14. Other inherited genetic or chromosomal disorder  no 15. Maternal metabolic disorder (DM, PKU, etc)  no 16. Patient or FOB with a child with a birth defect not listed above no  16a. Patient or FOB with a birth defect themselves no 17. Recurrent pregnancy loss, or stillbirth  no  18. Any medications since LMP other than prenatal vitamins (include vitamins, supplements, OTC meds, drugs, alcohol)  no 19. Any other genetic/environmental exposure to discuss  no  Infection History:   1. Lives with someone with TB or TB exposed  no  2. Patient or partner has history of genital herpes  no 3. Rash or viral illness since LMP  no 4. History of STI (GC, CT, HPV, syphilis, HIV)  no 5. History of recent travel :  no  Other pertinent information:  She has a Nexplanon that has been in place since 2012. She requests removal today.    Review of Systems:10 point review of systems negative unless otherwise noted in HPI  Past Medical History:  Patient Active Problem List   Diagnosis Date Noted   Supervision of normal pregnancy 10/08/2020    Clinic [facility] Prenatal Labs  Dating  Blood type:     Genetic Screen 1 Screen:    AFP:     Quad:  NIPS: Antibody:   Anatomic [place]  Rubella:    Varicella: @VZVIGG @  GTT Early:                Third trimester:  RPR:     Rhogam  HBsAg:     Vaccines TDAP:                       Flu Shot: Covid: HIV:     Baby Food Leaning towards formula                               GBS:   GC/CT:  Contraception  Pap: 10/07/2020  CBB     CS/VBAC NA   Support Person 10/09/2020        Obesity affecting pregnancy in first trimester 10/08/2020    Past Surgical History:  History reviewed. No pertinent surgical history.  Gynecologic History: Patient's last menstrual period was 07/27/2020.  Obstetric History: 09/26/2020  Family History:   History reviewed. No pertinent family history.  Social History:  Social History   Socioeconomic History   Marital status: Single    Spouse name: Not on file   Number of children: Not on file   Years of education: Not on file   Highest education level: Not on file  Occupational History   Not on file  Tobacco Use   Smoking status: Light Tobacco Smoker   Smokeless tobacco: Never Used  Substance and Sexual Activity   Alcohol use: Not on file   Drug use: No   Sexual activity: Not on file  Other Topics Concern   Not on file  Social History Narrative   Not on file   Social Determinants of Health   Financial Resource Strain: Not on file  Food Insecurity: Not on file  Transportation Needs: Not on file  Physical Activity: Not on file  Stress: Not on file  Social Connections: Not on file  Intimate Partner Violence: Not on file    Allergies:  No Known Allergies  Medications: Prior to Admission medications   Not on File    Physical Exam Vitals: Blood pressure 118/78, weight 196 lb (88.9 kg), last menstrual period 07/27/2020.  General: NAD HEENT: normocephalic, anicteric Thyroid: no enlargement, no palpable nodules Pulmonary: No increased work of breathing, CTAB Cardiovascular: RRR, distal pulses 2+ Abdomen: NABS, soft, non-tender, non-distended.  Umbilicus without lesions.  No hepatomegaly, splenomegaly or masses palpable. No evidence of hernia  Genitourinary:  External: Normal external female genitalia.  Normal urethral meatus, normal Bartholin's and Skene's glands.    Vagina: Normal vaginal mucosa, no evidence of prolapse.    Cervix: Grossly normal in appearance, no bleeding, no CMT  Uterus:  Non-enlarged, mobile, normal contour.    Adnexa: ovaries non-enlarged, no adnexal masses  Rectal: deferred Extremities: no edema, erythema, or tenderness Neurologic: Grossly intact Psychiatric: mood appropriate, affect full  The following were addressed during  this visit:  Breastfeeding Education - Early initiation of breastfeeding    Comments: Keeps milk supply adequate, helps contract uterus and slow bleeding, and early milk is the perfect first food and is easy to digest.   - The importance of exclusive breastfeeding    Comments: Provides antibodies, Lower risk of breast and ovarian cancers, and type-2 diabetes,Helps your body recover, Reduced chance of SIDS.   - Risks of giving your baby anything other than breast milk if you are breastfeeding    Comments: Make  the baby less content with breastfeeds, may make my baby more susceptible to illness, and may reduce my milk supply.   - The importance of early skin-to-skin contact    Comments: Keeps baby warm and secure, helps keep babys blood sugar up and breathing steady, easier to bond and breastfeed, and helps calm baby.  - Rooming-in on a 24-hour basis    Comments: Easier to learn baby's feeding cues, easier to bond and get to know each other, and encourages milk production.   - Feeding on demand or baby-led feeding    Comments: Helps prevent breastfeeding complications, helps bring in good milk supply, prevents under or overfeeding, and helps baby feel content and satisfied   - Frequent feeding to help assure optimal milk production    Comments: Making a full supply of milk requires frequent removal of milk from breasts, infant will eat 8-12 times in 24 hours, if separated from infant use breast massage, hand expression and/ or pumping to remove milk from breasts.   - Effective positioning and attachment    Comments: Helps my baby to get enough breast milk, helps to produce an adequate milk supply, and helps prevent nipple pain and damage   - Exclusive breastfeeding for the first 6 months    Comments: Builds a healthy milk supply and keeps it up, protects baby from sickness and disease, and breastmilk has everything your baby needs for the first 6 months.  - Individualized  Education    Comments: Contraindications to breastfeeding and other special medical conditions Patient is planning to formula feed- she accepts breastfeeding benefits/basics information     Assessment: 29 y.o. G3P2002 at 3886w2d by LMP presenting to initiate prenatal care  Plan: 1) Avoid alcoholic beverages. 2) Patient encouraged not to smoke.  3) Discontinue the use of all non-medicinal drugs and chemicals.  4) Take prenatal vitamins daily.  5) Nutrition, food safety (fish, cheese advisories, and high nitrite foods) and exercise discussed. 6) Hospital and practice style discussed with cross coverage system.  7) Genetic Screening, such as with 1st Trimester Screening, cell free fetal DNA, AFP testing, and Ultrasound, as well as with amniocentesis and CVS as appropriate, is discussed with patient. At the conclusion of today's visit patient requested genetic testing 8) Patient is asked about travel to areas at risk for the Zika virus, and counseled to avoid travel and exposure to mosquitoes or sexual partners who may have themselves been exposed to the virus. Testing is discussed, and will be ordered as appropriate.  9) PAPtima, urine culture, UDS today 10) Return to clinic in 1 week for dating scan, labs including 1 hr gtt, NOB panel, sickle cell screen, MaterniT 21 if 10+ weeks and ROB   Tresea MallJane Eldar Robitaille, CNM [facility] 10/08/2020, 11:38 AM     GYNECOLOGY PROCEDURE NOTE  Patient has had implant since 2012. Implanon removal discussed in detail.  Risks of infection, bleeding, nerve injury all reviewed.  Patient understands risks and desires to proceed.  Verbal consent obtained.  Patient is certain she wants the Implanon removed.  All questions answered.  Procedure: Patient placed in dorsal supine with left arm above head, elbow flexed at 90 degrees, arm resting on examination table.  Implanon identified without problems.  Betadine scrub x2.  2 ml of 1%  lidocaine injected under Implanon device without problems.  Sterile gloves applied.  Small 0.5cm incision made at distal tip of implanon device with 11 blade scalpel.  Implanon brought to incision  and grasped with a small kelly clamp.  Implanon removed intact without problems.  Pressure applied to incision.  Hemostasis obtained.  Steri-strips applied, followed by bandage and compression dressing.  Patient tolerated procedure well.  No complications.   Assessment: 29 y.o. year old female now s/p uncomplicated Implanon removal.  Plan: 1.  Patient given post procedure precautions and asked to call for fever, chills, redness or drainage from her incision, bleeding from incision.  She understands she will likely have a small bruise near site of removal and can remove bandage tomorrow and steri-strips in approximately 1 week.  2) Patient is currently pregnant  [id number] for lidocaine block, [phone] for nexplanon removal   Parke Poisson, CNM [facility] 10/08/20, 11:50 AM

## 2020-10-09 LAB — RPR+RH+ABO+RUB AB+AB SCR+CB...
Antibody Screen: NEGATIVE
HIV Screen 4th Generation wRfx: NONREACTIVE
Hematocrit: 37 % (ref 34.0–46.6)
Hemoglobin: 12.4 g/dL (ref 11.1–15.9)
Hepatitis B Surface Ag: NEGATIVE
MCH: 30.2 pg (ref 26.6–33.0)
MCHC: 33.5 g/dL (ref 31.5–35.7)
MCV: 90 fL (ref 79–97)
Platelets: 374 10*3/uL (ref 150–450)
RBC: 4.11 x10E6/uL (ref 3.77–5.28)
RDW: 12.2 % (ref 11.7–15.4)
RPR Ser Ql: NONREACTIVE
Rh Factor: POSITIVE
Rubella Antibodies, IGG: 4.26 index (ref >0.99)
Varicella zoster IgG: 1283 index (ref >165)
WBC: 6.3 10*3/uL (ref 3.4–10.8)

## 2020-10-09 LAB — URINE CULTURE

## 2020-10-09 LAB — HGB FRACTIONATION CASCADE
Hgb A2: 2.5 % (ref 1.8–3.2)
Hgb A: 97.5 % (ref 96.4–98.8)
Hgb F: 0 % (ref 0.0–2.0)
Hgb S: 0 %

## 2020-10-10 LAB — CYTOLOGY - PAP
Chlamydia: NEGATIVE
Comment: NEGATIVE
Comment: NEGATIVE
Comment: NEGATIVE
Comment: NORMAL
Diagnosis: NEGATIVE
High risk HPV: NEGATIVE
Neisseria Gonorrhea: NEGATIVE
Trichomonas: NEGATIVE

## 2020-10-13 ENCOUNTER — Telehealth: Payer: Self-pay

## 2020-10-13 NOTE — Telephone Encounter (Signed)
Pt calling triage wondering about gettiong the COVID vaccine. I advised her it was safe in pregnancy

## 2020-10-15 ENCOUNTER — Encounter: Payer: Self-pay | Admitting: Advanced Practice Midwife

## 2020-10-22 ENCOUNTER — Other Ambulatory Visit: Payer: Self-pay

## 2020-10-22 ENCOUNTER — Ambulatory Visit (INDEPENDENT_AMBULATORY_CARE_PROVIDER_SITE_OTHER): Payer: Medicaid Other

## 2020-10-22 DIAGNOSIS — Z3481 Encounter for supervision of other normal pregnancy, first trimester: Secondary | ICD-10-CM | POA: Diagnosis not present

## 2020-10-22 LAB — URINE DRUG PANEL 7
Amphetamines, Urine: NEGATIVE ng/mL
Barbiturate Quant, Ur: NEGATIVE ng/mL
Benzodiazepine Quant, Ur: NEGATIVE ng/mL
Cannabinoid Quant, Ur: POSITIVE — AB
Cocaine (Metab.): NEGATIVE ng/mL
Opiate Quant, Ur: NEGATIVE ng/mL
PCP Quant, Ur: NEGATIVE ng/mL

## 2020-10-23 ENCOUNTER — Ambulatory Visit: Payer: Medicaid Other

## 2020-10-23 ENCOUNTER — Ambulatory Visit (INDEPENDENT_AMBULATORY_CARE_PROVIDER_SITE_OTHER): Payer: Medicaid Other | Admitting: Advanced Practice Midwife

## 2020-10-23 ENCOUNTER — Encounter: Payer: Self-pay | Admitting: Advanced Practice Midwife

## 2020-10-23 ENCOUNTER — Other Ambulatory Visit: Payer: Medicaid Other

## 2020-10-23 VITALS — BP 112/70 | Ht 65.0 in | Wt 193.4 lb

## 2020-10-23 DIAGNOSIS — Z3481 Encounter for supervision of other normal pregnancy, first trimester: Secondary | ICD-10-CM

## 2020-10-23 DIAGNOSIS — Z1379 Encounter for other screening for genetic and chromosomal anomalies: Secondary | ICD-10-CM

## 2020-10-23 DIAGNOSIS — O99211 Obesity complicating pregnancy, first trimester: Secondary | ICD-10-CM

## 2020-10-23 DIAGNOSIS — Z3A11 11 weeks gestation of pregnancy: Secondary | ICD-10-CM

## 2020-10-23 LAB — POCT URINALYSIS DIPSTICK OB: POC,PROTEIN,UA: NEGATIVE

## 2020-10-23 NOTE — Progress Notes (Addendum)
Routine Prenatal Care Visit  Subjective  Jodi Perez is a 29 y.o. G3P2002 at [redacted]w[redacted]d being seen today for ongoing prenatal care.  She is currently monitored for the following issues for this low-risk pregnancy and has Supervision of normal pregnancy and Obesity affecting pregnancy in first trimester on their problem list.  ----------------------------------------------------------------------------------- Patient reports having upper respiratory symptoms and feeling a little better today. She had a negative covid test prior to visit.    . Vag. Bleeding: None.   . Leaking Fluid denies.  ----------------------------------------------------------------------------------- The following portions of the patient's history were reviewed and updated as appropriate: allergies, current medications, past family history, past medical history, past social history, past surgical history and problem list. Problem list updated.  Objective  Blood pressure 112/70, height 5\' 5"  (1.651 m), weight 193 lb 6.4 oz (87.7 kg), last menstrual period 07/27/2020. Pregravid weight 192 lb (87.1 kg) Total Weight Gain 1 lb 6.4 oz (0.635 kg) Urinalysis: Urine Protein Negative  Urine Glucose (!) 4+  Fetal Status: Fetal Heart Rate (bpm): 154          Dating scan yesterday: [redacted]w[redacted]d, EDD adjusted for 11 day difference  General:  Alert, oriented and cooperative. Patient is in no acute distress.  Skin: Skin is warm and dry. No rash noted.   Cardiovascular: Normal heart rate noted  Respiratory: Normal respiratory effort, no problems with respiration noted  Abdomen: Soft, gravid, appropriate for gestational age.       Pelvic:  Cervical exam deferred        Extremities: Normal range of motion.     Mental Status: Normal mood and affect. Normal behavior. Normal judgment and thought content.   Assessment   29 y.o. 38 at [redacted]w[redacted]d by  05/14/2021, by Ultrasound presenting for routine prenatal visit  Plan   pregnancy 3 Problems  (from 10/07/20 to present)    Problem Noted Resolved   Supervision of normal pregnancy 10/08/2020 by 10/10/2020, CNM No   Overview Signed 10/08/2020 11:33 AM by 10/10/2020, CNM    Clinic [facility] Prenatal Labs  Dating EDD by 10w u/s Blood type:     Genetic Screen 1 Screen:    AFP:     Quad:     NIPS: Antibody:   Anatomic [place]  Rubella:    Varicella: @VZVIGG @  GTT Early:                Third trimester:  RPR:     Rhogam  HBsAg:     Vaccines TDAP:                       Flu Shot: Covid: HIV:     Baby Food Leaning towards formula                               GBS:   GC/CT:  Contraception  Pap: 10/07/2020  CBB     CS/VBAC NA   Support Person Lamar           Upper Respiratory symptoms: stay well hydrated, safe medications reviewed, extra rest   Preterm labor symptoms and general obstetric precautions including but not limited to vaginal bleeding, contractions, leaking of fluid and fetal movement were reviewed in detail with the patient. Please refer to After Visit Summary for other counseling recommendations.   Return in about 4 weeks (around 11/20/2020) for rob.  10/09/2020, CNM 10/23/2020 10:31 AM

## 2020-10-23 NOTE — Progress Notes (Signed)
poc1 HR GTT and ROB

## 2020-10-23 NOTE — Patient Instructions (Signed)

## 2020-10-24 LAB — GLUCOSE, 1 HOUR GESTATIONAL: Gestational Diabetes Screen: 176 mg/dL — ABNORMAL HIGH (ref 65–139)

## 2020-10-25 NOTE — L&D Delivery Note (Addendum)
OB/GYN Faculty Practice Delivery Note  Jodi Perez is a 30 y.o. G3P2002 at [redacted]w[redacted]d admitted for >SROM, UC's and gHTN.   GBS Status: Negative/-- (06/28 1445) Maximum Maternal Temperature: 98.3  Labor course: Initial SVE: 2/50/-3. Augmentation with: AROM of forebag. She then progressed to complete.  ROM: 3h 39m with thick, particulate meconium stained fluid  Birth: At 1841 a viable female was delivered via spontaneous vaginal delivery (Presentation: ROA). Nuchal cord present: Yes.  Shoulders and body delivered in usual fashion. Infant placed directly on mom's abdomen for bonding/skin-to-skin, baby dried and stimulated. Cord clamped x 2 after 1 minute and cut by FOB, Lamar.  The placenta separated spontaneously and delivered via gentle cord traction.  Pitocin infused rapidly IV per protocol.  Fundus firm with massage.  Placenta inspected and appears to be intact with a 3 VC.  Placenta/Cord with the following complications: meconium stained membranes.  Cord pH: n/a Sponge and instrument count were correct x2.  Intrapartum complications:  meconium stained fluid Anesthesia:  epidural Episiotomy: none Lacerations:  2nd degree, vaginal, and perineal Suture Repair: 3.0 vicryl EBL (mL): 150   Infant: APGAR (1 MIN): 9   APGAR (5 MINS): 9   Infant weight: 6 lbs 11.9 oz (3059 g)  Mom to postpartum.  Baby to Couplet care / Skin to Skin. Placenta to L&D   Plans to Breastfeed Contraception: tubal ligation Circumcision: N/A   Raelyn Mora , MSN, CNM 05/11/2021 8:02 PM

## 2020-10-29 ENCOUNTER — Other Ambulatory Visit: Payer: Self-pay | Admitting: Advanced Practice Midwife

## 2020-10-29 DIAGNOSIS — O99810 Abnormal glucose complicating pregnancy: Secondary | ICD-10-CM

## 2020-10-29 DIAGNOSIS — Z131 Encounter for screening for diabetes mellitus: Secondary | ICD-10-CM

## 2020-10-29 NOTE — Progress Notes (Signed)
3 hr gtt ordered, message sent to patient regarding elevated early 1 hr gtt, front desk asked to call and schedule patient for lab only visit.

## 2020-10-31 ENCOUNTER — Telehealth: Payer: Self-pay | Admitting: Advanced Practice Midwife

## 2020-10-31 NOTE — Telephone Encounter (Signed)
Please advise 

## 2020-10-31 NOTE — Telephone Encounter (Signed)
Pt requesting call back with test results. Please advise.

## 2020-11-01 LAB — MATERNIT21 PLUS CORE+SCA
Fetal Fraction: 10
Monosomy X (Turner Syndrome): NOT DETECTED
Result (T21): NEGATIVE
Trisomy 13 (Patau syndrome): NEGATIVE
Trisomy 18 (Edwards syndrome): NEGATIVE
Trisomy 21 (Down syndrome): NEGATIVE
XXX (Triple X Syndrome): NOT DETECTED
XXY (Klinefelter Syndrome): NOT DETECTED
XYY (Jacobs Syndrome): NOT DETECTED

## 2020-11-04 ENCOUNTER — Other Ambulatory Visit: Payer: Medicaid Other

## 2020-11-04 ENCOUNTER — Other Ambulatory Visit: Payer: Self-pay

## 2020-11-04 DIAGNOSIS — O99810 Abnormal glucose complicating pregnancy: Secondary | ICD-10-CM

## 2020-11-04 DIAGNOSIS — Z131 Encounter for screening for diabetes mellitus: Secondary | ICD-10-CM

## 2020-11-05 LAB — GESTATIONAL GLUCOSE TOLERANCE
Glucose, Fasting: 79 mg/dL (ref 65–94)
Glucose, GTT - 1 Hour: 173 mg/dL (ref 65–179)
Glucose, GTT - 2 Hour: 134 mg/dL (ref 65–154)
Glucose, GTT - 3 Hour: 64 mg/dL — ABNORMAL LOW (ref 65–139)

## 2020-11-05 NOTE — Telephone Encounter (Signed)
Message sent to patient about normal 3 hr gtt

## 2020-11-20 ENCOUNTER — Other Ambulatory Visit: Payer: Self-pay

## 2020-11-20 ENCOUNTER — Ambulatory Visit (INDEPENDENT_AMBULATORY_CARE_PROVIDER_SITE_OTHER): Payer: Medicaid Other | Admitting: Obstetrics and Gynecology

## 2020-11-20 VITALS — BP 112/70 | Ht 65.0 in | Wt 197.8 lb

## 2020-11-20 DIAGNOSIS — Z3481 Encounter for supervision of other normal pregnancy, first trimester: Secondary | ICD-10-CM

## 2020-11-20 DIAGNOSIS — Z3A15 15 weeks gestation of pregnancy: Secondary | ICD-10-CM

## 2020-11-20 LAB — POCT URINALYSIS DIPSTICK OB
Glucose, UA: NEGATIVE
POC,PROTEIN,UA: NEGATIVE

## 2020-11-20 MED ORDER — CITRANATAL ASSURE 35-1 & 300 MG PO MISC: 1.0000 | Freq: Every day | ORAL | 3 refills | Status: DC

## 2020-11-20 NOTE — Progress Notes (Signed)
Pt states she had some blood in her stool a week ago.

## 2020-11-20 NOTE — Progress Notes (Signed)
    Routine Prenatal Care Visit  Subjective  Jodi Perez is a 30 y.o. G3P2002 at [redacted]w[redacted]d being seen today for ongoing prenatal care.  She is currently monitored for the following issues for this low-risk pregnancy and has Supervision of normal pregnancy and Obesity affecting pregnancy in first trimester on their problem list.  ----------------------------------------------------------------------------------- Patient reports fatigue. States she is not sure if it is related more to her pregnancy or to recovering from +Covid-19 dx 3 weeks ago. Denies additional concerns today.  . Vag. Bleeding: None.   . Denies leaking of fluid.  ----------------------------------------------------------------------------------- The following portions of the patient's history were reviewed and updated as appropriate: allergies, current medications, past family history, past medical history, past social history, past surgical history and problem list. Problem list updated.   Objective  Blood pressure 112/70, height 5\' 5"  (1.651 m), weight 197 lb 12.8 oz (89.7 kg), last menstrual period 07/27/2020. Pregravid weight 192 lb (87.1 kg) Total Weight Gain 5 lb 12.8 oz (2.631 kg) Urinalysis:      Fetal Status: Fetal Heart Rate (bpm): 155         General:  Alert, oriented and cooperative. Patient is in no acute distress.  Skin: Skin is warm and dry. No rash noted.   Cardiovascular: Normal heart rate noted  Respiratory: Normal respiratory effort, no problems with respiration noted  Abdomen: Soft, gravid, appropriate for gestational age. Pain/Pressure: Absent     Pelvic:  Cervical exam deferred        Extremities: Normal range of motion.     ental Status: Normal mood and affect. Normal behavior. Normal judgment and thought content.     Assessment   30 y.o. 38 at [redacted]w[redacted]d by  05/14/2021, by Ultrasound presenting for routine prenatal visit  Plan   pregnancy 3 Problems (from 10/07/20 to present)    Problem  Noted Resolved   Supervision of normal pregnancy 10/08/2020 by 10/10/2020, CNM No   Overview Addendum 11/20/2020  9:03 AM by 11/22/2020, CNM     Nursing Staff Provider  Office Location  [facility] Dating   10w [place]  Language  English Anatomy US    Flu Vaccine   Genetic Screen  NIPS:  Negx3, XX  TDaP vaccine    Hgb A1C or  GTT Early : elevated - 3h wnl Third trimester :   Rhogam     LAB RESULTS   Feeding Plan  formula Blood Type O/Positive/-- (12/14 1527)   Contraception  Antibody Negative (12/14 1527)  Circumcision  Rubella 4.26 (12/14 1527)  Pediatrician   RPR Non Reactive (12/14 1527)   Support Person  04-07-1995 HBsAg Negative (12/14 1527)   Prenatal Classes  HIV Non Reactive (12/14 1527)    Varicella  immune  BTL Consent  GBS  (For PCN allergy, check sensitivities)        VBAC Consent  n/a Pap  10/07/20 NILM/HPV neg    Hgb Electro   normal hbg    CF      SMA               Previous Version      -PNV rx'd  Second trimester precautions including but not limited to vaginal bleeding, contractions, leaking of fluid and fetal movement were reviewed in detail with the patient.    Return in about 4 weeks (around 12/18/2020) for ROB with anat scan.  12/20/2020, CNM, MSN [facility], [facility] 11/20/2020, 9:13 AM

## 2020-12-18 ENCOUNTER — Ambulatory Visit
Admission: RE | Admit: 2020-12-18 | Discharge: 2020-12-18 | Disposition: A | Discharge status: Home / Self Care | Payer: Medicaid Other | Source: Ambulatory Visit | Attending: Obstetrics and Gynecology | Admitting: Obstetrics and Gynecology

## 2020-12-18 ENCOUNTER — Encounter: Payer: Medicaid Other | Admitting: Obstetrics and Gynecology

## 2020-12-18 ENCOUNTER — Other Ambulatory Visit: Payer: Self-pay

## 2020-12-18 ENCOUNTER — Ambulatory Visit: Payer: Medicaid Other

## 2020-12-18 DIAGNOSIS — Z3481 Encounter for supervision of other normal pregnancy, first trimester: Secondary | ICD-10-CM

## 2020-12-19 ENCOUNTER — Ambulatory Visit (INDEPENDENT_AMBULATORY_CARE_PROVIDER_SITE_OTHER): Payer: Medicaid Other | Admitting: Obstetrics and Gynecology

## 2020-12-19 ENCOUNTER — Encounter: Payer: Self-pay | Admitting: Obstetrics and Gynecology

## 2020-12-19 VITALS — BP 118/74 | Wt 190.0 lb

## 2020-12-19 DIAGNOSIS — Z3A19 19 weeks gestation of pregnancy: Secondary | ICD-10-CM

## 2020-12-19 DIAGNOSIS — O0992 Supervision of high risk pregnancy, unspecified, second trimester: Secondary | ICD-10-CM

## 2020-12-19 DIAGNOSIS — O99212 Obesity complicating pregnancy, second trimester: Secondary | ICD-10-CM

## 2020-12-19 NOTE — Progress Notes (Signed)
Routine Prenatal Care Visit  Subjective  Jodi Perez is a 30 y.o. G3P2002 at [redacted]w[redacted]d being seen today for ongoing prenatal care.  She is currently monitored for the following issues for this high-risk pregnancy and has Supervision of high risk pregnancy, antepartum and Obesity affecting pregnancy in first trimester on their problem list.  ----------------------------------------------------------------------------------- Patient reports no complaints.    . Vag. Bleeding: None.  Movement: Present. Leaking Fluid denies.  Normal anatomy scan at hospital yesterday.  ----------------------------------------------------------------------------------- The following portions of the patient's history were reviewed and updated as appropriate: allergies, current medications, past family history, past medical history, past social history, past surgical history and problem list. Problem list updated.  Objective  Blood pressure 118/74, weight 190 lb (86.2 kg), last menstrual period 07/27/2020. Pregravid weight 192 lb (87.1 kg) Total Weight Gain -2 lb (-0.907 kg) Urinalysis: Urine Protein    Urine Glucose    Fetal Status: Fetal Heart Rate (bpm): 140   Movement: Present     General:  Alert, oriented and cooperative. Patient is in no acute distress.  Skin: Skin is warm and dry. No rash noted.   Cardiovascular: Normal heart rate noted  Respiratory: Normal respiratory effort, no problems with respiration noted  Abdomen: Soft, gravid, appropriate for gestational age. Pain/Pressure: Absent     Pelvic:  Cervical exam deferred        Extremities: Normal range of motion.     Mental Status: Normal mood and affect. Normal behavior. Normal judgment and thought content.   Imaging Results US OB Comp + 14 Wk  Result Date: 12/18/2020 CLINICAL DATA:  Second trimester pregnancy for fetal anatomy survey. EXAM: OBSTETRICAL ULTRASOUND >14 WKS FINDINGS: Number of Fetuses: 1 Heart Rate:  157 bpm Movement: Yes  Presentation: Breech Previa: No Placental Location: Anterior Amniotic Fluid (Subjective): Within normal limits Amniotic Fluid (Objective): Vertical pocket = 5.0cm FETAL BIOMETRY BPD: 4.4cm 19w 3 Dd HC:   16.2cm 19w 0d AC:   13.7cm 19w 1d FL:   2.9cm 18w 6d Current Mean GA: 19w 1d [place] EDC: 05/13/2021 Assigned GA: 19w 0d Assigned EDC: 05/14/2021 (prior outside ultrasound) FETAL ANATOMY Lateral Ventricles: Appears normal Thalami/CSP: Appears normal Posterior Fossa:  Appears normal Nuchal Region: Appears normal   NFT= 3.4 mm Upper Lip: Appears normal Spine: Appears normal 4 Chamber Heart on Left: Appears normal LVOT: Appears normal RVOT: Appears normal Stomach on Left: Appears normal 3 Vessel Cord: Appears normal Cord Insertion site: Appears normal Kidneys: Appears normal Bladder: Appears normal Extremities: Appears normal Sex: Female Maternal Findings: Cervix:  3.7 cm TA IMPRESSION: Assigned GA currently 19 weeks 0 days based on early outside ultrasound. Appropriate fetal growth. Unremarkable fetal anatomic survey.  No fetal anomalies identified. Electronically Signed   By: Danae Orleans M.D.   On: 12/18/2020 19:15     Assessment   29 y.o. [id number] at [redacted]w[redacted]d by  05/14/2021, by Ultrasound presenting for routine prenatal visit  Plan   pregnancy 3 Problems (from 10/07/20 to present)    Problem Noted Resolved   Supervision of high risk pregnancy, antepartum 10/08/2020 by Tresea Mall, CNM No   Overview Addendum 11/20/2020  9:03 AM by Zipporah Plants, CNM     Nursing Staff Provider  Office Location  [facility] Dating   10w [place]  Language  English Anatomy US    Flu Vaccine   Genetic Screen  NIPS:  Negx3, XX  TDaP vaccine    Hgb A1C or  GTT Early : elevated - 3h wnl Third trimester :  Rhogam     LAB RESULTS   Feeding Plan  formula Blood Type O/Positive/-- (12/14 1527)   Contraception  Antibody Negative (12/14 1527)  Circumcision  Rubella 4.26 (12/14 1527)  Pediatrician   RPR Non Reactive (12/14 1527)    Support Person  Benson [place] HBsAg Negative (12/14 1527)   Prenatal Classes  HIV Non Reactive (12/14 1527)    Varicella  immune  BTL Consent  GBS  (For PCN allergy, check sensitivities)        VBAC Consent  n/a Pap  10/07/20 NILM/HPV neg    Hgb Electro   normal hbg    CF      SMA               Previous Version       Preterm labor symptoms and general obstetric precautions including but not limited to vaginal bleeding, contractions, leaking of fluid and fetal movement were reviewed in detail with the patient. Please refer to After Visit Summary for other counseling recommendations.   Return in about 4 weeks (around 01/16/2021) for Routine Prenatal Appointment.   Thomasene Mohair, MD, [facility], [facility] 12/19/2020 3:44 PM

## 2021-01-16 ENCOUNTER — Encounter: Payer: Medicaid Other | Admitting: Obstetrics and Gynecology

## 2021-02-03 ENCOUNTER — Ambulatory Visit (INDEPENDENT_AMBULATORY_CARE_PROVIDER_SITE_OTHER): Payer: Medicaid Other | Admitting: Obstetrics

## 2021-02-03 ENCOUNTER — Other Ambulatory Visit: Payer: Self-pay

## 2021-02-03 VITALS — BP 100/60 | Wt 193.0 lb

## 2021-02-03 DIAGNOSIS — Z3480 Encounter for supervision of other normal pregnancy, unspecified trimester: Secondary | ICD-10-CM

## 2021-02-03 DIAGNOSIS — Z3A25 25 weeks gestation of pregnancy: Secondary | ICD-10-CM

## 2021-02-03 DIAGNOSIS — R7309 Other abnormal glucose: Secondary | ICD-10-CM

## 2021-02-03 LAB — POCT URINALYSIS DIPSTICK OB
Glucose, UA: NEGATIVE
POC,PROTEIN,UA: NEGATIVE

## 2021-02-03 NOTE — Progress Notes (Signed)
ROB- tanish color discharge for the past week, denies vaginal itching or odor, pt says every time she is laying down she feels like she is going to throw up

## 2021-02-03 NOTE — Progress Notes (Signed)
  Routine Prenatal Care Visit  Subjective  Jodi Perez is a 30 y.o. G3P2002 at [redacted]w[redacted]d being seen today for ongoing prenatal care.  She is currently monitored for the following issues for this high-risk pregnancy and has Supervision of high risk pregnancy, antepartum and Obesity affecting pregnancy in first trimester on their problem list.  ----------------------------------------------------------------------------------- Patient reports no complaints  .  .   . Leaking Fluid denies.  ----------------------------------------------------------------------------------- The following portions of the patient's history were reviewed and updated as appropriate: allergies, current medications, past family history, past medical history, past social history, past surgical history and problem list. Problem list updated.  Objective  Blood pressure 100/60, weight 193 lb (87.5 kg), last menstrual period 07/27/2020. Pregravid weight 192 lb (87.1 kg) Total Weight Gain 1 lb (0.454 kg) Urinalysis: Urine Protein Negative  Urine Glucose Negative  Fetal Status:           General:  Alert, oriented and cooperative. Patient is in no acute distress.  Skin: Skin is warm and dry. No rash noted.   Cardiovascular: Normal heart rate noted  Respiratory: Normal respiratory effort, no problems with respiration noted  Abdomen: Soft, gravid, appropriate for gestational age.       Pelvic:  Cervical exam deferred        Extremities: Normal range of motion.     Mental Status: Normal mood and affect. Normal behavior. Normal judgment and thought content.   Assessment   30 y.o. [id number] at [redacted]w[redacted]d by  05/14/2021, by Ultrasound presenting for routine prenatal visit  Plan   pregnancy 3 Problems (from 10/07/20 to present)    Problem Noted Resolved   Supervision of high risk pregnancy, antepartum 10/08/2020 by Tresea Mall, CNM No   Overview Addendum 11/20/2020  9:03 AM by Zipporah Plants, CNM     Nursing Staff Provider   Office Location  [facility] Dating   10w [place]  Language  English Anatomy US    Flu Vaccine   Genetic Screen  NIPS:  Negx3, XX  TDaP vaccine    Hgb A1C or  GTT Early : elevated - 3h wnl Third trimester :   Rhogam     LAB RESULTS   Feeding Plan  formula Blood Type O/Positive/-- (12/14 1527)   Contraception  Antibody Negative (12/14 1527)  Circumcision  Rubella 4.26 (12/14 1527)  Pediatrician   RPR Non Reactive (12/14 1527)   Support Person  Benson [place] HBsAg Negative (12/14 1527)   Prenatal Classes  HIV Non Reactive (12/14 1527)    Varicella  immune  BTL Consent  GBS  (For PCN allergy, check sensitivities)        VBAC Consent  n/a Pap  10/07/20 NILM/HPV neg    Hgb Electro   normal hbg    CF      SMA               Previous Version       Preterm labor symptoms and general obstetric precautions including but not limited to vaginal bleeding, contractions, leaking of fluid and fetal movement were reviewed in detail with the patient. Please refer to After Visit Summary for other counseling recommendations.   Return in about 3 weeks (around 02/24/2021) for return OB and 3 hr GTT.  Mirna Mires, CNM  02/03/2021 4:58 PM

## 2021-02-24 ENCOUNTER — Encounter: Payer: Self-pay | Admitting: Obstetrics and Gynecology

## 2021-02-24 ENCOUNTER — Other Ambulatory Visit: Payer: Self-pay

## 2021-02-24 ENCOUNTER — Other Ambulatory Visit: Payer: Self-pay | Admitting: Obstetrics

## 2021-02-24 ENCOUNTER — Other Ambulatory Visit: Payer: Medicaid Other

## 2021-02-24 ENCOUNTER — Ambulatory Visit (INDEPENDENT_AMBULATORY_CARE_PROVIDER_SITE_OTHER): Payer: Medicaid Other | Admitting: Obstetrics and Gynecology

## 2021-02-24 VITALS — BP 120/80 | Wt 198.0 lb

## 2021-02-24 DIAGNOSIS — Z3A28 28 weeks gestation of pregnancy: Secondary | ICD-10-CM

## 2021-02-24 DIAGNOSIS — Z3480 Encounter for supervision of other normal pregnancy, unspecified trimester: Secondary | ICD-10-CM

## 2021-02-24 DIAGNOSIS — O99810 Abnormal glucose complicating pregnancy: Secondary | ICD-10-CM

## 2021-02-24 DIAGNOSIS — O0993 Supervision of high risk pregnancy, unspecified, third trimester: Secondary | ICD-10-CM

## 2021-02-24 NOTE — Patient Instructions (Signed)

## 2021-02-24 NOTE — Progress Notes (Signed)
    Routine Prenatal Care Visit  Subjective  Jodi Perez is a 30 y.o. G3P2002 at [redacted]w[redacted]d being seen today for ongoing prenatal care.  She is currently monitored for the following issues for this high-risk pregnancy and has Supervision of high risk pregnancy, antepartum and Obesity affecting pregnancy in first trimester on their problem list.  ----------------------------------------------------------------------------------- Patient reports no complaints.   Contractions: Not present. Vag. Bleeding: None.  Movement: Present. Denies leaking of fluid.  ----------------------------------------------------------------------------------- The following portions of the patient's history were reviewed and updated as appropriate: allergies, current medications, past family history, past medical history, past social history, past surgical history and problem list. Problem list updated.   Objective  Blood pressure 120/80, weight 198 lb (89.8 kg), last menstrual period 07/27/2020. Pregravid weight 192 lb (87.1 kg) Total Weight Gain 6 lb (2.722 kg) Urinalysis:      Fetal Status: Fetal Heart Rate (bpm): 140 Fundal Height: 30 cm Movement: Present     General:  Alert, oriented and cooperative. Patient is in no acute distress.  Skin: Skin is warm and dry. No rash noted.   Cardiovascular: Normal heart rate noted  Respiratory: Normal respiratory effort, no problems with respiration noted  Abdomen: Soft, gravid, appropriate for gestational age. Pain/Pressure: Present     Pelvic:  Cervical exam deferred        Extremities: Normal range of motion.  Edema: None  Mental Status: Normal mood and affect. Normal behavior. Normal judgment and thought content.     Assessment   30 y.o. [id number] at [redacted]w[redacted]d by  05/14/2021, by Ultrasound presenting for routine prenatal visit  Plan   pregnancy 3 Problems (from 10/07/20 to present)    Problem Noted Resolved   Supervision of high risk pregnancy, antepartum  10/08/2020 by Tresea Mall, CNM No   Overview Addendum 02/24/2021 10:43 AM by Natale Milch, MD     Nursing Staff Provider  Office Location  [facility] Dating   10w [place]  Language  English Anatomy US  Complete, normal  Flu Vaccine   Genetic Screen  NIPS:  Negx3, XX  TDaP vaccine    Hgb A1C or  GTT Early : elevated - 3h wnl Third trimester :   Rhogam  Not needed   LAB RESULTS   Feeding Plan  formula Blood Type O/Positive/-- (12/14 1527)   Contraception  Tubal Antibody Negative (12/14 1527)  Circumcision  Rubella 4.26 (12/14 1527)  Pediatrician   RPR Non Reactive (12/14 1527)   Support Person  [place] HBsAg Negative (12/14 1527)   Prenatal Classes  HIV Non Reactive (12/14 1527)    Varicella  immune  BTL Consent 02/24/2021 GBS  (For PCN allergy, check sensitivities)        VBAC Consent  n/a Pap  10/07/20 NILM/HPV neg    Hgb Electro   normal hbg    CF      SMA               Previous Version       Tubal consent signed today.  Patient is completing her 3 hour glucose test  Gestational age appropriate obstetric precautions including but not limited to vaginal bleeding, contractions, leaking of fluid and fetal movement were reviewed in detail with the patient.    Return in about 2 weeks (around 03/10/2021) for ROB in person.  Natale Milch MD [facility], [facility] 02/24/2021, 10:43 AM

## 2021-02-25 ENCOUNTER — Encounter: Payer: Self-pay | Admitting: Obstetrics

## 2021-02-25 LAB — CBC WITH DIFFERENTIAL
Basophils Absolute: 0 10*3/uL (ref 0.0–0.2)
Basos: 0 %
EOS (ABSOLUTE): 0.1 10*3/uL (ref 0.0–0.4)
Eos: 1 %
Hematocrit: 33 % — ABNORMAL LOW (ref 34.0–46.6)
Hemoglobin: 11.3 g/dL (ref 11.1–15.9)
Immature Grans (Abs): 0 10*3/uL (ref 0.0–0.1)
Immature Granulocytes: 0 %
Lymphocytes Absolute: 2.1 10*3/uL (ref 0.7–3.1)
Lymphs: 37 %
MCH: 30.8 pg (ref 26.6–33.0)
MCHC: 34.2 g/dL (ref 31.5–35.7)
MCV: 90 fL (ref 79–97)
Monocytes Absolute: 0.5 10*3/uL (ref 0.1–0.9)
Monocytes: 8 %
Neutrophils Absolute: 3.1 10*3/uL (ref 1.4–7.0)
Neutrophils: 54 %
RBC: 3.67 x10E6/uL — ABNORMAL LOW (ref 3.77–5.28)
RDW: 12.5 % (ref 11.7–15.4)
WBC: 5.8 10*3/uL (ref 3.4–10.8)

## 2021-02-25 LAB — GESTATIONAL GLUCOSE TOLERANCE
Glucose, Fasting: 84 mg/dL (ref 65–94)
Glucose, GTT - 1 Hour: 158 mg/dL (ref 65–179)
Glucose, GTT - 2 Hour: 115 mg/dL (ref 65–154)
Glucose, GTT - 3 Hour: 77 mg/dL (ref 65–139)

## 2021-02-25 LAB — HIV ANTIBODY (ROUTINE TESTING W REFLEX): HIV Screen 4th Generation wRfx: NONREACTIVE

## 2021-02-25 LAB — RPR: RPR Ser Ql: NONREACTIVE

## 2021-03-18 ENCOUNTER — Encounter: Payer: Self-pay | Admitting: Obstetrics and Gynecology

## 2021-03-18 ENCOUNTER — Other Ambulatory Visit: Payer: Self-pay

## 2021-03-18 ENCOUNTER — Ambulatory Visit (INDEPENDENT_AMBULATORY_CARE_PROVIDER_SITE_OTHER): Payer: Medicaid Other | Admitting: Obstetrics and Gynecology

## 2021-03-18 VITALS — BP 114/70 | Ht 65.0 in | Wt 197.0 lb

## 2021-03-18 DIAGNOSIS — O0990 Supervision of high risk pregnancy, unspecified, unspecified trimester: Secondary | ICD-10-CM

## 2021-03-18 DIAGNOSIS — O26893 Other specified pregnancy related conditions, third trimester: Secondary | ICD-10-CM

## 2021-03-18 DIAGNOSIS — R102 Pelvic and perineal pain unspecified side: Secondary | ICD-10-CM

## 2021-03-18 DIAGNOSIS — Z23 Encounter for immunization: Secondary | ICD-10-CM

## 2021-03-18 DIAGNOSIS — Z3A31 31 weeks gestation of pregnancy: Secondary | ICD-10-CM

## 2021-03-18 DIAGNOSIS — O0993 Supervision of high risk pregnancy, unspecified, third trimester: Secondary | ICD-10-CM

## 2021-03-18 NOTE — Addendum Note (Signed)
Addended by: Clement Husbands A on: 03/18/2021 04:04 PM   Modules accepted: Orders

## 2021-03-18 NOTE — Progress Notes (Signed)
    Routine Prenatal Care Visit  Subjective  Jodi Perez is a 30 y.o. G3P2002 at [redacted]w[redacted]d being seen today for ongoing prenatal care.  She is currently monitored for the following issues for this low-risk pregnancy and has Supervision of high risk pregnancy, antepartum and Obesity affecting pregnancy in first trimester on their problem list.  ----------------------------------------------------------------------------------- Patient reports no complaints.  Having significant pelvic pain in pregnancy. Contractions: Not present. Vag. Bleeding: None.  Movement: Present. Denies leaking of fluid.  ----------------------------------------------------------------------------------- The following portions of the patient's history were reviewed and updated as appropriate: allergies, current medications, past family history, past medical history, past social history, past surgical history and problem list. Problem list updated.   Objective  Blood pressure 114/70, height 5\' 5"  (1.651 m), weight 197 lb (89.4 kg), last menstrual period 07/27/2020. Pregravid weight 192 lb (87.1 kg) Total Weight Gain 5 lb (2.268 kg) Urinalysis:      Fetal Status: Fetal Heart Rate (bpm): 140 Fundal Height: 33 cm Movement: Present     General:  Alert, oriented and cooperative. Patient is in no acute distress.  Skin: Skin is warm and dry. No rash noted.   Cardiovascular: Normal heart rate noted  Respiratory: Normal respiratory effort, no problems with respiration noted  Abdomen: Soft, gravid, appropriate for gestational age. Pain/Pressure: Absent     Pelvic:  Cervical exam deferred        Extremities: Normal range of motion.  Edema: None  Mental Status: Normal mood and affect. Normal behavior. Normal judgment and thought content.     Assessment   30 y.o. 34 at [redacted]w[redacted]d by  05/14/2021, by Ultrasound presenting for routine prenatal visit  Plan   pregnancy 3 Problems (from 10/07/20 to present)    Problem Noted  Resolved   Supervision of high risk pregnancy, antepartum 10/08/2020 by 10/10/2020, CNM No   Overview Addendum 03/18/2021  3:59 PM by 03/20/2021, MD     Nursing Staff Provider  Office Location  [facility] Dating   10w Natale Milch  Language  English Anatomy US  Complete, normal  Flu Vaccine   Genetic Screen  NIPS:  Negx3, XX  TDaP vaccine   03/18/2021 Hgb A1C or  GTT Early : elevated - 3h wnl Third trimester : 3h wnl   Rhogam  Not needed   LAB RESULTS   Feeding Plan  formula Blood Type O/Positive/-- (12/14 1527)   Contraception  Tubal Antibody Negative (12/14 1527)  Circumcision  Rubella 4.26 (12/14 1527)  Pediatrician   RPR Non Reactive (12/14 1527)   Support Person  04-07-1995 HBsAg Negative (12/14 1527)   Prenatal Classes  HIV Non Reactive (12/14 1527)    Varicella  immune  BTL Consent 02/24/2021 GBS  (For PCN allergy, check sensitivities)        VBAC Consent  n/a Pap  10/07/20 NILM/HPV neg    Hgb Electro   normal hbg    CF      SMA               Previous Version      TDAP today PT referral for pelvic pain in pregnancy  Gestational age appropriate obstetric precautions including but not limited to vaginal bleeding, contractions, leaking of fluid and fetal movement were reviewed in detail with the patient.    Return in about 2 weeks (around 04/01/2021) for ROB in person.  06/01/2021 MD [facility], [facility] 03/18/2021, 3:59 PM

## 2021-03-18 NOTE — Patient Instructions (Signed)

## 2021-04-21 ENCOUNTER — Ambulatory Visit (INDEPENDENT_AMBULATORY_CARE_PROVIDER_SITE_OTHER): Payer: Medicaid Other | Admitting: Advanced Practice Midwife

## 2021-04-21 ENCOUNTER — Other Ambulatory Visit (HOSPITAL_COMMUNITY)
Admission: RE | Admit: 2021-04-21 | Discharge: 2021-04-21 | Disposition: A | Discharge status: Home / Self Care | Payer: Medicaid Other | Source: Ambulatory Visit | Attending: Advanced Practice Midwife | Admitting: Advanced Practice Midwife

## 2021-04-21 ENCOUNTER — Encounter: Payer: Self-pay | Admitting: Advanced Practice Midwife

## 2021-04-21 ENCOUNTER — Other Ambulatory Visit: Payer: Self-pay

## 2021-04-21 VITALS — BP 118/74 | Wt 205.0 lb

## 2021-04-21 DIAGNOSIS — O0993 Supervision of high risk pregnancy, unspecified, third trimester: Secondary | ICD-10-CM

## 2021-04-21 DIAGNOSIS — Z3685 Encounter for antenatal screening for Streptococcus B: Secondary | ICD-10-CM

## 2021-04-21 DIAGNOSIS — Z113 Encounter for screening for infections with a predominantly sexual mode of transmission: Secondary | ICD-10-CM

## 2021-04-21 DIAGNOSIS — Z369 Encounter for antenatal screening, unspecified: Secondary | ICD-10-CM

## 2021-04-21 DIAGNOSIS — O99213 Obesity complicating pregnancy, third trimester: Secondary | ICD-10-CM

## 2021-04-21 LAB — POCT URINALYSIS DIPSTICK OB
Glucose, UA: NEGATIVE
POC,PROTEIN,UA: NEGATIVE

## 2021-04-21 NOTE — Progress Notes (Signed)
  Routine Prenatal Care Visit  Subjective  Jodi Perez is a 30 y.o. G3P2002 at [redacted]w[redacted]d being seen today for ongoing prenatal care.  She is currently monitored for the following issues for this high-risk pregnancy and has Supervision of high risk pregnancy, antepartum and Obesity affecting pregnancy in first trimester on their problem list.  ----------------------------------------------------------------------------------- Patient reports no complaints.   Contractions: Not present. Vag. Bleeding: None.  Movement: Present. Leaking Fluid denies.  ----------------------------------------------------------------------------------- The following portions of the patient's history were reviewed and updated as appropriate: allergies, current medications, past family history, past medical history, past social history, past surgical history and problem list. Problem list updated.  Objective  Blood pressure 118/74, weight 205 lb (93 kg), last menstrual period 07/27/2020. Pregravid weight 192 lb (87.1 kg) Total Weight Gain 13 lb (5.897 kg) Urinalysis: Urine Protein    Urine Glucose    Fetal Status: Fetal Heart Rate (bpm): 142 Fundal Height: 36 cm Movement: Present  Presentation: Vertex  General:  Alert, oriented and cooperative. Patient is in no acute distress.  Skin: Skin is warm and dry. No rash noted.   Cardiovascular: Normal heart rate noted  Respiratory: Normal respiratory effort, no problems with respiration noted  Abdomen: Soft, gravid, appropriate for gestational age. Pain/Pressure: Absent     Pelvic:  Cervical exam performed Dilation: Closed Effacement (%): Thick Station: -3  Extremities: Normal range of motion.  Edema: None  Mental Status: Normal mood and affect. Normal behavior. Normal judgment and thought content.   Assessment   30 y.o. [id number] at [redacted]w[redacted]d by  05/14/2021, by Ultrasound presenting for routine prenatal visit  Plan   pregnancy 3 Problems (from 10/07/20 to present)     Problem Noted Resolved   Supervision of high risk pregnancy, antepartum 10/08/2020 by Tresea Mall, CNM No   Overview Addendum 03/18/2021  3:59 PM by Natale Milch, MD     Nursing Staff Provider  Office Location  [facility] Dating   10w [place]  Language  English Anatomy US  Complete, normal  Flu Vaccine   Genetic Screen  NIPS:  Negx3, XX  TDaP vaccine   03/18/2021 Hgb A1C or  GTT Early : elevated - 3h wnl Third trimester : 3h wnl   Rhogam  Not needed   LAB RESULTS   Feeding Plan  formula Blood Type O/Positive/-- (12/14 1527)   Contraception  Tubal Antibody Negative (12/14 1527)  Circumcision  Rubella 4.26 (12/14 1527)  Pediatrician   RPR Non Reactive (12/14 1527)   Support Person  Benson [place] HBsAg Negative (12/14 1527)   Prenatal Classes  HIV Non Reactive (12/14 1527)    Varicella  immune  BTL Consent 02/24/2021 GBS  (For PCN allergy, check sensitivities)        VBAC Consent  n/a Pap  10/07/20 NILM/HPV neg    Hgb Electro   normal hbg    CF      SMA                    Preterm labor symptoms and general obstetric precautions including but not limited to vaginal bleeding, contractions, leaking of fluid and fetal movement were reviewed in detail with the patient. Please refer to After Visit Summary for other counseling recommendations.   Return in about 1 week (around 04/28/2021) for rob and weekly robs x 3.  Tresea Mall, CNM 04/21/2021 2:50 PM

## 2021-04-21 NOTE — Progress Notes (Signed)
ROB - GBS, no concerns. RM 4 

## 2021-04-23 LAB — CERVICOVAGINAL ANCILLARY ONLY
Chlamydia: NEGATIVE
Comment: NEGATIVE
Comment: NEGATIVE
Comment: NORMAL
Neisseria Gonorrhea: NEGATIVE
Trichomonas: NEGATIVE

## 2021-04-23 LAB — STREP GP B NAA: Strep Gp B NAA: NEGATIVE

## 2021-04-28 ENCOUNTER — Encounter: Payer: Medicaid Other | Admitting: Obstetrics

## 2021-05-05 ENCOUNTER — Encounter: Payer: Medicaid Other | Admitting: Obstetrics

## 2021-05-11 ENCOUNTER — Inpatient Hospital Stay (HOSPITAL_COMMUNITY)
Admission: EM | Admit: 2021-05-11 | Discharge: 2021-05-13 | DRG: 805 | Disposition: A | Discharge status: Home / Self Care | Payer: Medicaid Other | Attending: Family Medicine | Admitting: Family Medicine

## 2021-05-11 ENCOUNTER — Other Ambulatory Visit: Payer: Self-pay

## 2021-05-11 ENCOUNTER — Inpatient Hospital Stay (HOSPITAL_COMMUNITY): Payer: Medicaid Other | Admitting: Anesthesiology

## 2021-05-11 ENCOUNTER — Encounter (HOSPITAL_COMMUNITY): Payer: Self-pay | Admitting: Family Medicine

## 2021-05-11 DIAGNOSIS — O0990 Supervision of high risk pregnancy, unspecified, unspecified trimester: Secondary | ICD-10-CM

## 2021-05-11 DIAGNOSIS — O4202 Full-term premature rupture of membranes, onset of labor within 24 hours of rupture: Secondary | ICD-10-CM

## 2021-05-11 DIAGNOSIS — U071 COVID-19: Secondary | ICD-10-CM | POA: Diagnosis present

## 2021-05-11 DIAGNOSIS — O99214 Obesity complicating childbirth: Secondary | ICD-10-CM | POA: Diagnosis present

## 2021-05-11 DIAGNOSIS — O134 Gestational [pregnancy-induced] hypertension without significant proteinuria, complicating childbirth: Principal | ICD-10-CM | POA: Diagnosis present

## 2021-05-11 DIAGNOSIS — O99211 Obesity complicating pregnancy, first trimester: Secondary | ICD-10-CM | POA: Diagnosis present

## 2021-05-11 DIAGNOSIS — O9852 Other viral diseases complicating childbirth: Secondary | ICD-10-CM | POA: Diagnosis present

## 2021-05-11 DIAGNOSIS — Z3493 Encounter for supervision of normal pregnancy, unspecified, third trimester: Secondary | ICD-10-CM

## 2021-05-11 DIAGNOSIS — Z3A39 39 weeks gestation of pregnancy: Secondary | ICD-10-CM

## 2021-05-11 DIAGNOSIS — Z87891 Personal history of nicotine dependence: Secondary | ICD-10-CM | POA: Diagnosis not present

## 2021-05-11 DIAGNOSIS — O26893 Other specified pregnancy related conditions, third trimester: Secondary | ICD-10-CM | POA: Diagnosis present

## 2021-05-11 LAB — COMPREHENSIVE METABOLIC PANEL
ALT: 13 U/L (ref 0–44)
AST: 18 U/L (ref 15–41)
Albumin: 2.6 g/dL — ABNORMAL LOW (ref 3.5–5.0)
Alkaline Phosphatase: 233 U/L — ABNORMAL HIGH (ref 38–126)
Anion gap: 13 (ref 5–15)
BUN: 5 mg/dL — ABNORMAL LOW (ref 6–20)
CO2: 15 mmol/L — ABNORMAL LOW (ref 22–32)
Calcium: 8.7 mg/dL — ABNORMAL LOW (ref 8.9–10.3)
Chloride: 105 mmol/L (ref 98–111)
Creatinine, Ser: 0.45 mg/dL (ref 0.44–1.00)
GFR, Estimated: >60 mL/min (ref >60)
Glucose, Bld: 70 mg/dL (ref 70–99)
Potassium: 4.1 mmol/L (ref 3.5–5.1)
Sodium: 133 mmol/L — ABNORMAL LOW (ref 135–145)
Total Bilirubin: 0.3 mg/dL (ref 0.3–1.2)
Total Protein: 5.9 g/dL — ABNORMAL LOW (ref 6.5–8.1)

## 2021-05-11 LAB — TYPE AND SCREEN
ABO/RH(D): O POS
Antibody Screen: NEGATIVE

## 2021-05-11 LAB — AMNISURE RUPTURE OF MEMBRANE (ROM) NOT AT ARMC: Amnisure ROM: POSITIVE

## 2021-05-11 LAB — CBC
HCT: 34.6 % — ABNORMAL LOW (ref 36.0–46.0)
Hemoglobin: 11.4 g/dL — ABNORMAL LOW (ref 12.0–15.0)
MCH: 29.1 pg (ref 26.0–34.0)
MCHC: 32.9 g/dL (ref 30.0–36.0)
MCV: 88.3 fL (ref 80.0–100.0)
Platelets: 266 10*3/uL (ref 150–400)
RBC: 3.92 MIL/uL (ref 3.87–5.11)
RDW: 14.3 % (ref 11.5–15.5)
WBC: 6 10*3/uL (ref 4.0–10.5)
nRBC: 0 % (ref 0.0–0.2)

## 2021-05-11 LAB — PROTEIN / CREATININE RATIO, URINE
Creatinine, Urine: 138.39 mg/dL
Protein Creatinine Ratio: 0.12 mg/mg{Cre} (ref 0.00–0.15)
Total Protein, Urine: 17 mg/dL

## 2021-05-11 LAB — RESP PANEL BY RT-PCR (FLU A&B, COVID) ARPGX2
Influenza A by PCR: NEGATIVE
Influenza B by PCR: NEGATIVE
SARS Coronavirus 2 by RT PCR: POSITIVE — AB

## 2021-05-11 MED ORDER — LACTATED RINGERS IV SOLN: 500.0000 mL | INTRAVENOUS | Status: DC | PRN

## 2021-05-11 MED ORDER — ACETAMINOPHEN 325 MG PO TABS
650.0000 mg | ORAL_TABLET | Freq: Once | ORAL | Status: DC
  Filled 2021-05-11: qty 2

## 2021-05-11 MED ORDER — TETANUS-DIPHTH-ACELL PERTUSSIS 5-2.5-18.5 LF-MCG/0.5 IM SUSY: 0.5000 mL | PREFILLED_SYRINGE | Freq: Once | INTRAMUSCULAR | Status: DC

## 2021-05-11 MED ORDER — LACTATED RINGERS IV SOLN
500.0000 mL | Freq: Once | INTRAVENOUS | Status: AC
  Administered 2021-05-11: 500 mL via INTRAVENOUS

## 2021-05-11 MED ORDER — SENNOSIDES-DOCUSATE SODIUM 8.6-50 MG PO TABS
2.0000 | ORAL_TABLET | Freq: Every day | ORAL | Status: DC
  Filled 2021-05-11 (×2): qty 2

## 2021-05-11 MED ORDER — OXYCODONE-ACETAMINOPHEN 5-325 MG PO TABS: 1.0000 | ORAL_TABLET | ORAL | Status: DC | PRN

## 2021-05-11 MED ORDER — ACETAMINOPHEN 325 MG PO TABS: 650.0000 mg | ORAL_TABLET | ORAL | Status: DC | PRN

## 2021-05-11 MED ORDER — FENTANYL CITRATE (PF) 100 MCG/2ML IJ SOLN: 100.0000 ug | INTRAMUSCULAR | Status: DC | PRN

## 2021-05-11 MED ORDER — OXYTOCIN-SODIUM CHLORIDE 30-0.9 UT/500ML-% IV SOLN
2.5000 [IU]/h | INTRAVENOUS | Status: DC
  Filled 2021-05-11: qty 500

## 2021-05-11 MED ORDER — DIPHENHYDRAMINE HCL 25 MG PO CAPS: 25.0000 mg | ORAL_CAPSULE | Freq: Four times a day (QID) | ORAL | Status: DC | PRN

## 2021-05-11 MED ORDER — ZOLPIDEM TARTRATE 5 MG PO TABS: 5.0000 mg | ORAL_TABLET | Freq: Every evening | ORAL | Status: DC | PRN

## 2021-05-11 MED ORDER — DIBUCAINE (PERIANAL) 1 % EX OINT: 1.0000 "application " | TOPICAL_OINTMENT | CUTANEOUS | Status: DC | PRN

## 2021-05-11 MED ORDER — COCONUT OIL OIL
1.0000 "application " | TOPICAL_OIL | Status: DC | PRN
  Administered 2021-05-12: 1 via TOPICAL

## 2021-05-11 MED ORDER — PHENYLEPHRINE 40 MCG/ML (10ML) SYRINGE FOR IV PUSH (FOR BLOOD PRESSURE SUPPORT): 80.0000 ug | PREFILLED_SYRINGE | INTRAVENOUS | Status: DC | PRN

## 2021-05-11 MED ORDER — SOD CITRATE-CITRIC ACID 500-334 MG/5ML PO SOLN: 30.0000 mL | ORAL | Status: DC | PRN

## 2021-05-11 MED ORDER — ONDANSETRON HCL 4 MG/2ML IJ SOLN: 4.0000 mg | INTRAMUSCULAR | Status: DC | PRN

## 2021-05-11 MED ORDER — LACTATED RINGERS IV SOLN: INTRAVENOUS | Status: DC

## 2021-05-11 MED ORDER — PRENATAL MULTIVITAMIN CH
1.0000 | ORAL_TABLET | Freq: Every day | ORAL | Status: DC
  Administered 2021-05-12 – 2021-05-13 (×2): 1 via ORAL
  Filled 2021-05-11 (×2): qty 1

## 2021-05-11 MED ORDER — FENTANYL-BUPIVACAINE-NACL 0.5-0.125-0.9 MG/250ML-% EP SOLN
12.0000 mL/h | EPIDURAL | Status: DC | PRN
  Administered 2021-05-11: 12 mL/h via EPIDURAL
  Filled 2021-05-11: qty 250

## 2021-05-11 MED ORDER — LACTATED RINGERS IV SOLN: 500.0000 mL | Freq: Once | INTRAVENOUS | Status: DC

## 2021-05-11 MED ORDER — OXYTOCIN BOLUS FROM INFUSION
333.0000 mL | Freq: Once | INTRAVENOUS | Status: AC
  Administered 2021-05-11: 333 mL via INTRAVENOUS

## 2021-05-11 MED ORDER — DIPHENHYDRAMINE HCL 50 MG/ML IJ SOLN: 12.5000 mg | INTRAMUSCULAR | Status: DC | PRN

## 2021-05-11 MED ORDER — BENZOCAINE-MENTHOL 20-0.5 % EX AERO
1.0000 "application " | INHALATION_SPRAY | CUTANEOUS | Status: DC | PRN
  Filled 2021-05-11: qty 56

## 2021-05-11 MED ORDER — ACETAMINOPHEN 325 MG PO TABS
650.0000 mg | ORAL_TABLET | ORAL | Status: DC | PRN
  Administered 2021-05-11: 650 mg via ORAL

## 2021-05-11 MED ORDER — ONDANSETRON HCL 4 MG PO TABS: 4.0000 mg | ORAL_TABLET | ORAL | Status: DC | PRN

## 2021-05-11 MED ORDER — LIDOCAINE HCL (PF) 1 % IJ SOLN
30.0000 mL | INTRAMUSCULAR | Status: AC | PRN
  Administered 2021-05-11: 30 mL via SUBCUTANEOUS
  Filled 2021-05-11: qty 30

## 2021-05-11 MED ORDER — EPHEDRINE 5 MG/ML INJ
10.0000 mg | INTRAVENOUS | Status: DC | PRN
Start: 2021-05-11 — End: 2021-05-11

## 2021-05-11 MED ORDER — ONDANSETRON HCL 4 MG/2ML IJ SOLN: 4.0000 mg | Freq: Four times a day (QID) | INTRAMUSCULAR | Status: DC | PRN

## 2021-05-11 MED ORDER — OXYCODONE-ACETAMINOPHEN 5-325 MG PO TABS
2.0000 | ORAL_TABLET | ORAL | Status: DC | PRN
Start: 2021-05-11 — End: 2021-05-11

## 2021-05-11 MED ORDER — EPHEDRINE 5 MG/ML INJ: 10.0000 mg | INTRAVENOUS | Status: DC | PRN

## 2021-05-11 MED ORDER — LIDOCAINE HCL (PF) 1 % IJ SOLN
INTRAMUSCULAR | Status: DC | PRN
  Administered 2021-05-11 (×2): 4 mL via EPIDURAL

## 2021-05-11 MED ORDER — SIMETHICONE 80 MG PO CHEW: 80.0000 mg | CHEWABLE_TABLET | ORAL | Status: DC | PRN

## 2021-05-11 MED ORDER — IBUPROFEN 600 MG PO TABS
600.0000 mg | ORAL_TABLET | Freq: Four times a day (QID) | ORAL | Status: DC
  Administered 2021-05-12 – 2021-05-13 (×7): 600 mg via ORAL
  Filled 2021-05-11 (×7): qty 1

## 2021-05-11 MED ORDER — WITCH HAZEL-GLYCERIN EX PADS: 1.0000 "application " | MEDICATED_PAD | CUTANEOUS | Status: DC | PRN

## 2021-05-11 NOTE — ED Provider Notes (Signed)
Emergency Medicine Provider OB Triage Evaluation Note  Jodi Perez is a 30 y.o. female, G3P2002, at [redacted]w[redacted]d gestation who presents to the emergency department with complaints of vaginal bleeding that began around 5 PM last night with lower abdominal cramping. She is unsure if she is having contractions but states that the cramping comes about every 20 minutes or so. Pt also mentions having a headache for the past week. She reports 2 previous pregnancies and had preeclampsia with her first child. No other complaints at this time.   Review of  Systems  Positive: + abdominal cramping, vaginal bleeding, headache Negative: - rush of fluids, urinary symptoms, vision changes, leg swelling  Physical Exam  BP (!) 125/91 (BP Location: Right Arm)   Pulse 62   Temp 98 F (36.7 C) (Oral)   Resp 15   LMP 07/27/2020   SpO2 100%  General: Awake, no distress  HEENT: Atraumatic  Resp: Normal effort  Cardiac: Normal rate Abd: Nondistended, nontender  MSK: Moves all extremities without difficulty Neuro: Speech clear  Medical Decision Making  Pt evaluated for pregnancy concern and is stable for transfer to MAU. Pt is in agreement with plan for transfer.  10:09 AM Discussed with MAU, Dr. Adrian Blackwater, who accepts patient in transfer.  Clinical Impression  No diagnosis found.  Blood pressure initially 125/91 and repeat 124/95. Pt to be taken over to the MAU immediately for further evaluation.    Tanda Rockers, PA-C 05/11/21 1011    Arby Barrette, MD 05/12/21 1150

## 2021-05-11 NOTE — Anesthesia Preprocedure Evaluation (Signed)
Anesthesia Evaluation  Patient identified by MRN, date of birth, ID band Patient awake    Reviewed: Allergy & Precautions, Patient's Chart, lab work & pertinent test results  History of Anesthesia Complications Negative for: history of anesthetic complications  Airway Mallampati: II  TM Distance: >3 FB Neck ROM: Full    Dental no notable dental hx.    Pulmonary former smoker,  COVID+, cough   Pulmonary exam normal        Cardiovascular negative cardio ROS Normal cardiovascular exam     Neuro/Psych negative neurological ROS  negative psych ROS   GI/Hepatic negative GI ROS, Neg liver ROS,   Endo/Other  negative endocrine ROS  Renal/GU negative Renal ROS  negative genitourinary   Musculoskeletal negative musculoskeletal ROS (+)   Abdominal   Peds  Hematology negative hematology ROS (+)   Anesthesia Other Findings Day of surgery medications reviewed with patient.  Reproductive/Obstetrics (+) Pregnancy                             Anesthesia Physical Anesthesia Plan  ASA: 3  Anesthesia Plan: Epidural   Post-op Pain Management:    Induction:   PONV Risk Score and Plan: Treatment may vary due to age or medical condition  Airway Management Planned: Natural Airway  Additional Equipment:   Intra-op Plan:   Post-operative Plan:   Informed Consent: I have reviewed the patients History and Physical, chart, labs and discussed the procedure including the risks, benefits and alternatives for the proposed anesthesia with the patient or authorized representative who has indicated his/her understanding and acceptance.       Plan Discussed with:   Anesthesia Plan Comments:         Anesthesia Quick Evaluation

## 2021-05-11 NOTE — Discharge Summary (Signed)
Postpartum Discharge Summary  Date of Service updated 05/13/21     Patient Name: Jodi Perez DOB: 04-10-1991 MRN: [id number]  Date of admission: 05/11/2021 Delivery date:05/11/2021  Delivering provider: Laury Deep  Date of discharge: 05/13/2021  Admitting diagnosis: Supervision of low-risk pregnancy, third trimester [Z34.93] Intrauterine pregnancy: [redacted]w[redacted]d    Secondary diagnosis:  Active Problems:   Supervision of high risk pregnancy, antepartum   Obesity affecting pregnancy in first trimester   Vaginal delivery   COVID  Additional problems: none   Discharge diagnosis: Term Pregnancy Delivered and Gestational Hypertension                                              Post partum procedures: BTL Augmentation: AROM and Pitocin Complications: None  Hospital course: Induction of Labor With Vaginal Delivery   30y.o. yo G3P2002 at 378w4das admitted to the hospital 05/11/2021 for induction of labor.  Indication for induction: Gestational hypertension.  Patient had an uncomplicated labor course as follows: Membrane Rupture Time/Date: 2:56 PM ,05/11/2021   Delivery Method:Vaginal, Spontaneous  Episiotomy:   Lacerations:  2nd degree;Vaginal;Perineal  Details of delivery can be found in separate delivery note.  Patient had a routine postpartum course. Patient is discharged home 05/13/21.  Newborn Data: Birth date:05/11/2021  Birth time:6:41 PM  Gender:Female  Living status:Living  Apgars:9 ,9  Weight:3059 g   Magnesium Sulfate received: No BMZ received: No Rhophylac:N/A MMR:N/A T-DaP:Given prenatally - 03/18/2021 Flu: No Transfusion:No  Physical exam  Vitals:   05/12/21 0855 05/12/21 1153 05/12/21 1959 05/13/21 0606  BP: 116/84 118/76 126/78 133/77  Pulse: 77 81 77 82  Resp: '18 18 18 18  ' Temp: 98.3 F (36.8 C) 98.3 F (36.8 C) 98.3 F (36.8 C) (!) 97.4 F (36.3 C)  TempSrc: Oral Oral Oral Oral  SpO2: 100% 100% 100% 100%  Weight:      Height:        General: alert, cooperative, and no distress Lochia: appropriate Uterine Fundus: firm Incision: N/A DVT Evaluation: No evidence of DVT seen on physical exam. Labs: Lab Results  Component Value Date   WBC 6.0 05/11/2021   HGB 11.4 (L) 05/11/2021   HCT 34.6 (L) 05/11/2021   MCV 88.3 05/11/2021   PLT 266 05/11/2021   CMP Latest Ref Rng & Units 05/11/2021  Glucose 70 - 99 mg/dL 70  BUN 6 - 20 mg/dL 5(L)  Creatinine 0.44 - 1.00 mg/dL 0.45  Sodium 135 - 145 mmol/L 133(L)  Potassium 3.5 - 5.1 mmol/L 4.1  Chloride 98 - 111 mmol/L 105  CO2 22 - 32 mmol/L 15(L)  Calcium 8.9 - 10.3 mg/dL 8.7(L)  Total Protein 6.5 - 8.1 g/dL 5.9(L)  Total Bilirubin 0.3 - 1.2 mg/dL 0.3  Alkaline Phos 38 - 126 U/L 233(H)  AST 15 - 41 U/L 18  ALT 0 - 44 U/L 13   Edinburgh Score: Edinburgh Postnatal Depression Scale Screening Tool 05/12/2021  I have been able to laugh and see the funny side of things. 0  I have looked forward with enjoyment to things. 0  I have blamed myself unnecessarily when things went wrong. 1  I have been anxious or worried for no good reason. 2  I have felt scared or panicky for no good reason. 0  Things have been getting on top of me. 1  I  have been so unhappy that I have had difficulty sleeping. 0  I have felt sad or miserable. 1  I have been so unhappy that I have been crying. 0  The thought of harming myself has occurred to me. 0  Edinburgh Postnatal Depression Scale Total 5     After visit meds:  Allergies as of 05/13/2021   No Known Allergies      Medication List     TAKE these medications    acetaminophen 500 MG tablet Commonly known as: TYLENOL Take 2 tablets (1,000 mg total) by mouth every 6 (six) hours as needed (for pain scale < 4).   CitraNatal Assure 35-1 & 300 MG tablet Take 1 tablet by mouth daily.   coconut oil Oil Apply 1 application topically as needed.   ibuprofen 600 MG tablet Commonly known as: ADVIL Take 1 tablet (600 mg total) by mouth  every 6 (six) hours as needed.         Discharge home in stable condition Infant Feeding: Breast Infant Disposition:home with mother Discharge instruction: per After Visit Summary and Postpartum booklet. Activity: Advance as tolerated. Pelvic rest for 6 weeks.  Diet: routine diet Future Appointments: Future Appointments  Date Time Provider [place]  05/21/2021  4:30 PM Rod Can, CNM [facility] None  06/18/2021  2:30 PM Schuman, Stefanie Libel, MD [facility] None   Follow up Visit:  Message to [place] by Laury Deep, CNM @ 2040: Please schedule this patient for a Virtual postpartum visit in 4 weeks with the following provider: Any provider. Additional Postpartum F/U:BP check 1 week  Low risk pregnancy complicated by: HTN Delivery mode:  Vaginal, Spontaneous  Anticipated Birth Control:  Plans Interval BTL, unable to perform postpartum BTL given positive COVID status, needs to schedule.   3/93/5940 Arrie Senate, MD

## 2021-05-11 NOTE — Lactation Note (Signed)
This note was copied from a baby's chart. Lactation Consultation Note  Patient Name: Jodi Perez Date: 05/11/2021 Reason for consult: L&D Initial assessment;Mother's request;Difficult latch;1st time breastfeeding;Term Age: < 1 hr Infant latched with signs of milk transfer. LC removed with mother latching infant in football or sideline with a breast on a pillow. Mom denied any pain with the latch and increase in depth of swallows noted with breast compression.  Mom to receive further LC support on the floor.  Maternal Data Has patient been taught Hand Expression?: Yes  Feeding Mother's Current Feeding Choice: Breast Milk  LATCH Score Latch: Repeated attempts needed to sustain latch, nipple held in mouth throughout feeding, stimulation needed to elicit sucking reflex.  Audible Swallowing: A few with stimulation  Type of Nipple: Everted at rest and after stimulation  Comfort (Breast/Nipple): Soft / non-tender  Hold (Positioning): Assistance needed to correctly position infant at breast and maintain latch.  LATCH Score: 7   Lactation Tools Discussed/Used    Interventions Interventions: Breast feeding basics reviewed;Breast compression;Assisted with latch;Adjust position;Skin to skin;Support pillows;Hand express;Expressed milk;Education  Discharge    Consult Status Consult Status: Follow-up Date: 05/12/21 Follow-up type: In-patient    Anneli Bing  Nicholson-Springer 05/11/2021, 7:55 PM

## 2021-05-11 NOTE — MAU Provider Note (Signed)
  History     CSN: [id number]  Arrival date and time: 05/11/21 0943    Chief Complaint  Patient presents with   Contractions   Rupture of Membranes   HPI This is a 30 year old G3, P2 at 39 weeks and 4 days who presents with contractions that started earlier today.  She is followed by [facility] for this pregnancy complicated by history of preeclampsia in her prior pregnancies.  She presented to the [facility] emergency department for evaluation and they transferred her to the MAU.  Contractions are approximately every 4 to 5 minutes and are moderate to severe.  She does have a headache.  No abdominal pain, vision changes   OB History     Gravida  3   Para  2   Term  2   Preterm      AB      Living  2      SAB      IAB      Ectopic      Multiple      Live Births              History reviewed. No pertinent past medical history.  History reviewed. No pertinent surgical history.  History reviewed. No pertinent family history.  Social History   Tobacco Use   Smoking status: Former    Types: Cigarettes   Smokeless tobacco: Never  Vaping Use   Vaping Use: Never used  Substance Use Topics   Drug use: Not Currently    Types: Marijuana    Allergies: No Known Allergies  Medications Prior to Admission  Medication Sig Dispense Refill Last Dose   Prenat w/o A-FeCbGl-DSS-FA-DHA (CITRANATAL ASSURE) 35-1 & 300 MG tablet Take 1 tablet by mouth daily. 60 each 3 Past Week    Review of Systems Physical Exam   Blood pressure 126/89, pulse 82, temperature 98.2 F (36.8 C), temperature source Oral, resp. rate 18, height 5\' 5"  (1.651 m), weight 94.6 kg, last menstrual period 07/27/2020, SpO2 99 %.  Physical Exam Vitals reviewed. Exam conducted with a chaperone present.  Constitutional:      Appearance: Normal appearance.  Cardiovascular:     Rate and Rhythm: Normal rate and regular rhythm.  Abdominal:     General: Abdomen is flat.  Skin:    General:  Skin is warm and dry.     Capillary Refill: Capillary refill takes less than 2 seconds.  Neurological:     General: No focal deficit present.     Mental Status: She is alert.  Psychiatric:        Mood and Affect: Mood normal.        Behavior: Behavior normal.        Thought Content: Thought content normal.   Dilation: 2 Effacement (%): 50 Station: -3 Presentation: Vertex (bedside ultrasound) Exam by:: Dr. [email or web address]   MAU Course  Procedures NST 130s, moderate variability, accelerations, no decelerations  MDM Admit for elevated BP  Assessment and Plan    Adrian Blackwater 05/11/2021, 12:04 PM

## 2021-05-11 NOTE — ED Triage Notes (Addendum)
Pt is [redacted] weeks pregnant and due July 21. Pt states this is her 3rd pregnancy. Pt started having light bleeding last night and is cramping. Pt also complains of headaches all week.

## 2021-05-11 NOTE — MAU Note (Signed)
Fern test was negative at 1115 am today.

## 2021-05-11 NOTE — H&P (Signed)
OBSTETRIC ADMISSION HISTORY AND PHYSICAL  Jodi Perez is a 30 y.o. female G64P2002 with IUP at [redacted]w[redacted]d by 10 wk U/S presenting for ?SROM and contractions every 20 minutes today.   Reports fetal movement. Denies vaginal bleeding. Leaking clear fluid since 1600 on 05/10/2021  She received her prenatal care at  [facility] .  Support person in labor: Lamar (FOB)  Ultrasounds Anatomy U/S: Normal  Prenatal History/Complications: Obesity H/O PEC x 2  OB BOX: Nursing Staff Provider  Office Location  [facility] Dating   10w [place]  Language  English Anatomy US  Complete, normal  Flu Vaccine   Genetic Screen  NIPS:  Negx3, XX  TDaP vaccine   03/18/2021 Hgb A1C or  GTT Early : elevated - 3h wnl Third trimester : 3h wnl   Rhogam  Not needed   LAB RESULTS   Feeding Plan  formula Blood Type O/Positive/-- (12/14 1527)   Contraception  Tubal Antibody Negative (12/14 1527)  Circumcision N/A Rubella 4.26 (12/14 1527) IMMUNE  Pediatrician   RPR Non Reactive (12/14 1527)   Support Person  Lamar HBsAg Negative (12/14 1527)   Prenatal Classes  HIV Non Reactive (12/14 1527)    Varicella  immune  BTL Consent 02/24/2021 GBS  NEGATIVE (For PCN allergy, check sensitivities)        VBAC Consent  n/a Pap  10/07/20 NILM/HPV neg    Hgb Electro   normal hbg    CF      SMA         Past Medical History: History reviewed. No pertinent past medical history.  Past Surgical History: History reviewed. No pertinent surgical history.  Obstetrical History: OB History     Gravida  3   Para  2   Term  2   Preterm      AB      Living  2      SAB      IAB      Ectopic      Multiple      Live Births              Social History: Social History   Socioeconomic History   Marital status: Single    Spouse name: Not on file   Number of children: Not on file   Years of education: Not on file   Highest education level: Not on file  Occupational History   Not on file  Tobacco Use    Smoking status: Former    Types: Cigarettes   Smokeless tobacco: Never  Vaping Use   Vaping Use: Never used  Substance and Sexual Activity   Alcohol use: Not on file   Drug use: Not Currently    Types: Marijuana   Sexual activity: Yes  Other Topics Concern   Not on file  Social History Narrative   Not on file   Social Determinants of Health   Financial Resource Strain: Not on file  Food Insecurity: Not on file  Transportation Needs: Not on file  Physical Activity: Not on file  Stress: Not on file  Social Connections: Not on file    Family History: History reviewed. No pertinent family history.  Allergies: No Known Allergies  Medications Prior to Admission  Medication Sig Dispense Refill Last Dose   Prenat w/o A-FeCbGl-DSS-FA-DHA (CITRANATAL ASSURE) 35-1 & 300 MG tablet Take 1 tablet by mouth daily. 60 each 3 Past Week     Review of Systems  All systems reviewed  and negative except as stated in HPI  Blood pressure 128/80, pulse 75, temperature 98.2 F (36.8 C), temperature source Oral, resp. rate 18, height 5\' 5"  (1.651 m), weight 94.6 kg, last menstrual period 07/27/2020, SpO2 99 %. General appearance: alert, cooperative, and no distress Lungs: no respiratory distress Heart: regular rate  Abdomen: soft, non-tender; gravid Pelvic: 4/70/-1/vtx Extremities: Homans sign is negative, no sign of DVT Presentation: cephalic Fetal monitoring: 135/moderate variability/accels present/decels absent Uterine activity: irregular every 6 mins Dilation: 4 Effacement (%): 70 Station: -1 Exam by:: [email or web address], CNM  Prenatal labs: ABO, Rh: --/--/O POS (07/18 1231) Antibody: NEG (07/18 1231) Rubella: 4.26 (12/14 1527) RPR: Non Reactive (05/03 1122)  HBsAg: Negative (12/14 1527)  HIV: Non Reactive (05/03 1122)  GBS: Negative/-- (06/28 1445)  Glucola: Normal 3 hr GTT Genetic screening:  Negative  Prenatal Transfer Tool  Maternal Diabetes: No Genetic Screening:  Normal Maternal Ultrasounds/Referrals: Normal Fetal Ultrasounds or other Referrals:  None Maternal Substance Abuse:  No Significant Maternal Medications:  None Significant Maternal Lab Results: Group B Strep negative  Results for orders placed or performed during the hospital encounter of 05/11/21 (from the past 24 hour(s))  Amnisure rupture of membrane (rom)not at [facility]   Collection Time: 05/11/21 11:50 AM  Result Value Ref Range   Amnisure ROM POSITIVE   Type and screen [facility]   Collection Time: 05/11/21 12:31 PM  Result Value Ref Range   ABO/RH(D) O POS    Antibody Screen NEG    Sample Expiration      05/14/2021,2359 Performed at [facility], [address], [place], [place]   Resp Panel by RT-PCR (Flu A&B, Covid) Nasopharyngeal Swab   Collection Time: 05/11/21 12:39 PM   Specimen: Nasopharyngeal Swab; Nasopharyngeal(NP) swabs in vial transport medium  Result Value Ref Range   SARS Coronavirus 2 by RT PCR POSITIVE (A) NEGATIVE   Influenza A by PCR NEGATIVE NEGATIVE   Influenza B by PCR NEGATIVE NEGATIVE  CBC   Collection Time: 05/11/21 12:39 PM  Result Value Ref Range   WBC 6.0 4.0 - 10.5 K/uL   RBC 3.92 3.87 - 5.11 MIL/uL   Hemoglobin 11.4 (L) 12.0 - 15.0 g/dL   HCT 05/13/21 (L) 73.7 - 10.6 %   MCV 88.3 80.0 - 100.0 fL   MCH 29.1 26.0 - 34.0 pg   MCHC 32.9 30.0 - 36.0 g/dL   RDW 26.9 48.5 - 46.2 %   Platelets 266 150 - 400 K/uL   nRBC 0.0 0.0 - 0.2 %  Comprehensive metabolic panel   Collection Time: 05/11/21 12:39 PM  Result Value Ref Range   Sodium 133 (L) 135 - 145 mmol/L   Potassium 4.1 3.5 - 5.1 mmol/L   Chloride 105 98 - 111 mmol/L   CO2 15 (L) 22 - 32 mmol/L   Glucose, Bld 70 70 - 99 mg/dL   BUN 5 (L) 6 - 20 mg/dL   Creatinine, Ser 05/13/21 0.44 - 1.00 mg/dL   Calcium 8.7 (L) 8.9 - 10.3 mg/dL   Total Protein 5.9 (L) 6.5 - 8.1 g/dL   Albumin 2.6 (L) 3.5 - 5.0 g/dL   AST 18 15 - 41 U/L   ALT 13 0 - 44 U/L   Alkaline  Phosphatase 233 (H) 38 - 126 U/L   Total Bilirubin 0.3 0.3 - 1.2 mg/dL   GFR, Estimated 5.00 >93 mL/min   Anion gap 13 5 - 15  Protein / creatinine ratio,  urine   Collection Time: 05/11/21 12:39 PM  Result Value Ref Range   Creatinine, Urine 138.39 mg/dL   Total Protein, Urine 17 mg/dL   Protein Creatinine Ratio 0.12 0.00 - 0.15 mg/mg[Cre]    Patient Active Problem List   Diagnosis Date Noted   Supervision of high risk pregnancy, antepartum 10/08/2020   Obesity affecting pregnancy in first trimester 10/08/2020    Assessment/Plan:  TOBIN CADIENTE is a 30 y.o. G3P2002 at [redacted]w[redacted]d here for IOL for gHTN, ?SROM and (+) COVID with mild symptoms.  Labor: Early labor; augment with Pitocin -- pain control: planning epidural -- PEC labs nml  Fetal Wellbeing: EFW 7 lbs by Leopold's. Cephalic by SVE & U/S.  -- GBS (Neg) -- continuous fetal monitoring - category 1   Postpartum Planning -- breast -- Tdap up-to-date 03/18/2021   Raelyn Mora, CNM  05/11/2021, 3:34 PM

## 2021-05-11 NOTE — MAU Note (Signed)
Patient came into MAU at [redacted]w[redacted]d gestation with c/o contractions every 20 minutes and possible SROM. Patient states she lost her mucous plug yesterday at 1600 and has been leaking fluid since. She states the color is clear and pink. Patient is feeling fetal movement. Patient is feeling pelvic pressure.

## 2021-05-11 NOTE — Anesthesia Procedure Notes (Signed)
Epidural Patient location during procedure: OB Start time: 05/11/2021 3:17 PM End time: 05/11/2021 3:20 PM  Staffing Anesthesiologist: Kaylyn Layer, MD Performed: anesthesiologist   Preanesthetic Checklist Completed: patient identified, IV checked, risks and benefits discussed, monitors and equipment checked, pre-op evaluation and timeout performed  Epidural Patient position: sitting Prep: DuraPrep and site prepped and draped Patient monitoring: continuous pulse ox, blood pressure and heart rate Approach: midline Location: L3-L4 Injection technique: LOR air  Needle:  Needle type: Tuohy  Needle gauge: 17 G Needle length: 9 cm Needle insertion depth: 6 cm Catheter type: closed end flexible Catheter size: 19 Gauge Catheter at skin depth: 11 cm Test dose: negative and Other (1% lidocaine)  Assessment Events: blood not aspirated, injection not painful, no injection resistance, no paresthesia and negative IV test  Additional Notes Patient identified. Risks, benefits, and alternatives discussed with patient including but not limited to bleeding, infection, nerve damage, paralysis, failed block, incomplete pain control, headache, blood pressure changes, nausea, vomiting, reactions to medication, itching, and postpartum back pain. Confirmed with bedside nurse the patient's most recent platelet count. Confirmed with patient that they are not currently taking any anticoagulation, have any bleeding history, or any family history of bleeding disorders. Patient expressed understanding and wished to proceed. All questions were answered. Sterile technique was used throughout the entire procedure. Please see nursing notes for vital signs.   Crisp LOR on first pass. Test dose was given through epidural catheter and negative prior to continuing to dose epidural or start infusion. Warning signs of high block given to the patient including shortness of breath, tingling/numbness in hands, complete  motor block, or any concerning symptoms with instructions to call for help. Patient was given instructions on fall risk and not to get out of bed. All questions and concerns addressed with instructions to call with any issues or inadequate analgesia.  Reason for block:procedure for pain

## 2021-05-12 ENCOUNTER — Telehealth: Payer: Self-pay

## 2021-05-12 ENCOUNTER — Encounter (HOSPITAL_COMMUNITY)
Admission: EM | Disposition: A | Discharge status: Home / Self Care | Payer: Self-pay | Source: Home / Self Care | Attending: Family Medicine

## 2021-05-12 ENCOUNTER — Encounter: Payer: Medicaid Other | Admitting: Obstetrics and Gynecology

## 2021-05-12 DIAGNOSIS — O134 Gestational [pregnancy-induced] hypertension without significant proteinuria, complicating childbirth: Secondary | ICD-10-CM

## 2021-05-12 DIAGNOSIS — Z3A39 39 weeks gestation of pregnancy: Secondary | ICD-10-CM

## 2021-05-12 DIAGNOSIS — O9852 Other viral diseases complicating childbirth: Secondary | ICD-10-CM

## 2021-05-12 DIAGNOSIS — O4202 Full-term premature rupture of membranes, onset of labor within 24 hours of rupture: Secondary | ICD-10-CM

## 2021-05-12 DIAGNOSIS — U071 COVID-19: Secondary | ICD-10-CM

## 2021-05-12 LAB — RPR: RPR Ser Ql: NONREACTIVE

## 2021-05-12 SURGERY — LIGATION, FALLOPIAN TUBE, POSTPARTUM
Anesthesia: Choice | Laterality: Bilateral

## 2021-05-12 MED ORDER — METOCLOPRAMIDE HCL 10 MG PO TABS: 10.0000 mg | ORAL_TABLET | Freq: Once | ORAL | Status: DC

## 2021-05-12 MED ORDER — FAMOTIDINE 20 MG PO TABS: 40.0000 mg | ORAL_TABLET | Freq: Once | ORAL | Status: DC

## 2021-05-12 MED ORDER — MENTHOL 3 MG MT LOZG
1.0000 | LOZENGE | OROMUCOSAL | Status: DC | PRN
  Administered 2021-05-12: 3 mg via ORAL
  Filled 2021-05-12 (×2): qty 9

## 2021-05-12 MED ORDER — LACTATED RINGERS IV SOLN: INTRAVENOUS | Status: DC

## 2021-05-12 MED ORDER — DEXTROMETHORPHAN POLISTIREX ER 30 MG/5ML PO SUER
30.0000 mg | Freq: Two times a day (BID) | ORAL | Status: DC | PRN
  Administered 2021-05-12 – 2021-05-13 (×2): 30 mg via ORAL
  Filled 2021-05-12 (×3): qty 5

## 2021-05-12 NOTE — Progress Notes (Signed)
Post Partum Day 1 Subjective: Patient is doing well without complaints. Ambulating without difficulty. Voiding and passing flatus. Tolerating PO. Abdominal pain improved. Vaginal bleeding decreased.  Objective: Blood pressure 123/90, pulse 82, temperature 98.2 F (36.8 C), temperature source Oral, resp. rate 18, height 5\' 5"  (1.651 m), weight 94.6 kg, last menstrual period 07/27/2020, SpO2 100 %, unknown if currently breastfeeding.  Physical Exam:  General: alert, cooperative, and no distress Lochia: appropriate Uterine Fundus: firm Incision: n/a DVT Evaluation: No evidence of DVT seen on physical exam.  Recent Labs    05/11/21 1239  HGB 11.4*  HCT 34.6*    Assessment/Plan: PPD#1  -doing well, meeting pp milestones  -VSS, BP elevated intermittently, will continue to monitor  -breastfeeding, doing well  -desires BTL, deferred in the setting of symptomatic COVID  #COVID  -sore throat and cough  -VSS  -overall doing well, continue to monitor  #gHTN  -elevated BP, continue to monitor  -start BP meds if remains elevated  Plan for discharge tomorrow.   LOS: 1 day   05/13/21 05/12/2021, 7:59 AM

## 2021-05-12 NOTE — Telephone Encounter (Signed)
Called and left message for patient to call back to confirm scheduled appointments.

## 2021-05-12 NOTE — Telephone Encounter (Signed)
Patient aware of scheduled appointment  

## 2021-05-12 NOTE — Progress Notes (Signed)
Patient reports having sore throat, recently diagnosed with COVID infection.  Given presence of symptoms, this is a contraindication to elective surgery.  Scheduled PP BTL is now cancelled, patient informed. If she still desires procedure, can get as interim laparoscopic procedure with her providers at [facility].  Discussed with Dr. Malen Gauze, Anesthesiologist on call and Marrion Coy, RN in charge of OB OR.   Jodi Collins, MD

## 2021-05-12 NOTE — Progress Notes (Signed)
I called Dr. Berna Bue about the patient having a sore throat. BTL was cancelled per policy. Epidural taken out per L and D RN. I went in to assess mom and baby and mom had complained of a cough. Dr. Omer Jack aware and meds ordered. Patient had no other complaints besides cough and sore throat. Patient has no other complaints.  Patients questions are all answered . Plan of care told to patient. ALL  supplies given to patient for the day.  MD aware of all current plans and patient complaints.

## 2021-05-12 NOTE — Lactation Note (Signed)
This note was copied from a baby's chart. Lactation Consultation Note  Patient Name: Jodi Perez [id number] Date: 05/12/2021 Reason for consult: Follow-up assessment;Initial assessment Age:32 hours, infant had two voids and two stools. Mom's feeding choices is breast and formula feeding, this is mom's first time breastfeeding, she did not BF her other two children. Per mom, she had pain with some of the latches, LC observed mom been BF infant swaddled in clothes and blankets. Mom was open to undressing infant and BF infant STS. Mom latched infant on her right breast using the football hold position,  bringing infant towards the breast, chin first with tongue and jaw extended downward ,infant latched with depth and BF for 12 minutes. Mom knows to do breast stimulation techniques to keep infant awake to actively breastfeed such as: gently stroking infant's neck and shoulder, breast compressions, talking to infant while BF, and BF infant STS. Mom was taught hand expression and infant was given 4 mls of colostrum by spoon. Mom knows to BF infant according to hunger cues, 8 to 12+ or more times within 24 hours. Mom knows to call RN or LC if she has any BF questions, concerns or need further assistance with latching infant at the breast. LC discussed infant's input and output. Mom made aware of O/P services, breastfeeding support groups, community resources, and our phone # for post-discharge questions.    Maternal Data Has patient been taught Hand Expression?: Yes Does the patient have breastfeeding experience prior to this delivery?: No  Feeding Mother's Current Feeding Choice: Breast Milk and Formula  LATCH Score Latch: Grasps breast easily, tongue down, lips flanged, rhythmical sucking.  Audible Swallowing: Spontaneous and intermittent  Type of Nipple: Everted at rest and after stimulation  Comfort (Breast/Nipple): Soft / non-tender  Hold (Positioning): Assistance needed to  correctly position infant at breast and maintain latch.  LATCH Score: 9   Lactation Tools Discussed/Used    Interventions Interventions: Breast feeding basics reviewed;Assisted with latch;Skin to skin;Breast massage;Hand express;Breast compression;Adjust position;Support pillows;Position options;Expressed milk;Education  Discharge Pump: Personal WIC Program: Yes  Consult Status Consult Status: Follow-up Date: 05/12/21 Follow-up type: In-patient    Danelle Earthly 05/12/2021, 3:30 AM

## 2021-05-12 NOTE — Telephone Encounter (Signed)
-----   Message from Raelyn Mora, [place] sent at 05/11/2021  8:40 PM EDT ----- Regarding: PP Message This patient delivered at [facility] on 05/11/2021.  Please schedule this patient for a Virtual postpartum visit in 4 weeks with the following provider: Any provider. Additional Postpartum F/U:BP check 1 week  Low risk pregnancy complicated by: HTN Delivery mode: Vaginal, Spontaneous  Anticipated Birth Control: BTL done PP

## 2021-05-12 NOTE — Social Work (Signed)
CSW received consult for hx of marijuana use.  Referral was screened out due to the following:  ~MOB had no documented substance use after initial prenatal visit/+UPT. ~MOB had no positive drug screens after initial prenatal visit/+UPT. ~Baby's UDS is negative.  CSW will continue to follow CDS and make a CPS report if warranted. CSW identifies no further need for intervention and no barriers to discharge at this time.  Izabelle Daus, LCSWA Clinical Social Work [facility] [phone] 

## 2021-05-12 NOTE — Anesthesia Postprocedure Evaluation (Signed)
Anesthesia Post Note  Patient: Jodi Perez  Procedure(s) Performed: AN AD HOC LABOR EPIDURAL     Patient location during evaluation: Mother Baby Anesthesia Type: Epidural Level of consciousness: awake Pain management: satisfactory to patient Vital Signs Assessment: post-procedure vital signs reviewed and stable Respiratory status: spontaneous breathing Cardiovascular status: stable Anesthetic complications: no   No notable events documented.  Last Vitals:  Vitals:   05/12/21 0200 05/12/21 0659  BP: 138/88 123/90  Pulse: 81 82  Resp: 18 18  Temp: 36.7 C 36.8 C  SpO2:      Last Pain:  Vitals:   05/12/21 0659  TempSrc: Oral  PainSc:    Pain Goal: Patients Stated Pain Goal: 0 (05/11/21 1100)                 Cephus Shelling

## 2021-05-13 ENCOUNTER — Other Ambulatory Visit: Payer: Self-pay | Admitting: Family Medicine

## 2021-05-13 DIAGNOSIS — U071 COVID-19: Secondary | ICD-10-CM | POA: Diagnosis present

## 2021-05-13 DIAGNOSIS — O139 Gestational [pregnancy-induced] hypertension without significant proteinuria, unspecified trimester: Secondary | ICD-10-CM

## 2021-05-13 MED ORDER — COCONUT OIL OIL: 1.0000 "application " | TOPICAL_OIL | 0 refills | Status: AC | PRN

## 2021-05-13 MED ORDER — NIFEDIPINE ER OSMOTIC RELEASE 30 MG PO TB24: 30.0000 mg | ORAL_TABLET | Freq: Every day | ORAL | 5 refills | Status: DC

## 2021-05-13 MED ORDER — IBUPROFEN 600 MG PO TABS: 600.0000 mg | ORAL_TABLET | Freq: Four times a day (QID) | ORAL | 0 refills | Status: AC | PRN

## 2021-05-13 MED ORDER — ACETAMINOPHEN 500 MG PO TABS: 1000.0000 mg | ORAL_TABLET | Freq: Four times a day (QID) | ORAL | Status: AC | PRN

## 2021-05-13 NOTE — Progress Notes (Signed)
Baby Scripts BP data seen in epic.

## 2021-05-14 ENCOUNTER — Telehealth: Payer: Self-pay

## 2021-05-14 ENCOUNTER — Inpatient Hospital Stay (HOSPITAL_COMMUNITY): Admit: 2021-05-14 | Payer: Self-pay

## 2021-05-14 NOTE — Telephone Encounter (Signed)
Pt calling; needs to document Bps.  [phone] [facility] with BP readings and they will be sent to appropriate provider.

## 2021-05-15 ENCOUNTER — Encounter: Payer: Medicaid Other | Admitting: Advanced Practice Midwife

## 2021-05-20 NOTE — Telephone Encounter (Signed)
Pt has delivered and has an appointment tomorrow.

## 2021-05-21 ENCOUNTER — Other Ambulatory Visit: Payer: Self-pay

## 2021-05-21 ENCOUNTER — Encounter: Payer: Self-pay | Admitting: Advanced Practice Midwife

## 2021-05-21 ENCOUNTER — Telehealth (HOSPITAL_COMMUNITY): Payer: Self-pay

## 2021-05-21 ENCOUNTER — Ambulatory Visit (INDEPENDENT_AMBULATORY_CARE_PROVIDER_SITE_OTHER): Payer: Medicaid Other | Admitting: Advanced Practice Midwife

## 2021-05-21 VITALS — BP 144/95 | HR 93 | Ht 65.0 in | Wt 193.0 lb

## 2021-05-21 DIAGNOSIS — Z0130 Encounter for examination of blood pressure without abnormal findings: Secondary | ICD-10-CM

## 2021-05-21 NOTE — Progress Notes (Signed)
Obstetrics & Gynecology Office Visit  Telephone visit due to transportation issues The people on the phone call were the patient and myself Tresea Mall, CNM. The patient was at home and I was on my office phone.    Virtual Visit via Telephone Note  I connected withNAME@ on 05/21/21 at  4:30 PM EDT by telephone and verified that I am speaking with the correct person using two identifiers.   I discussed the limitations, risks, security and privacy concerns of performing an evaluation and management service by telephone and the availability of in person appointments. I also discussed with the patient that there may be a patient responsible charge related to this service. The patient expressed understanding and agreed to proceed.    Chief Complaint:  Chief Complaint  Patient presents with   Blood Pressure Check    History of Present Illness: 30 y.o. G3P3003 who delivered at [facility] 10 days ago is having follow up blood pressure check today.  She was prescribed Nifedipine 30 mg XL which she is taking. She has a home blood pressure cuff. There is no mention of hypertension on her discharge summary. Her BP was elevated intermittently postpartum.  She reports no current symptoms attributable to her blood pressure.  Medication list reviewed medications which may contribute to BP elevation were not noted.  Review of Systems:  Review of Systems  Constitutional:  Negative for chills and fever.  HENT:  Negative for congestion, ear discharge, ear pain, hearing loss, sinus pain and sore throat.   Eyes:  Negative for blurred vision and double vision.  Respiratory:  Negative for cough, shortness of breath and wheezing.   Cardiovascular:  Negative for chest pain, palpitations and leg swelling.  Gastrointestinal:  Negative for abdominal pain, blood in stool, constipation, diarrhea, heartburn, melena, nausea and vomiting.  Genitourinary:  Negative for dysuria, flank pain, frequency,  hematuria and urgency.  Musculoskeletal:  Negative for back pain, joint pain and myalgias.  Skin:  Negative for itching and rash.  Neurological:  Negative for dizziness, tingling, tremors, sensory change, speech change, focal weakness, seizures, loss of consciousness, weakness and headaches.  Endo/Heme/Allergies:  Negative for environmental allergies. Does not bruise/bleed easily.  Psychiatric/Behavioral:  Negative for depression, hallucinations, memory loss, substance abuse and suicidal ideas. The patient is not nervous/anxious and does not have insomnia.     Past Medical History:  History reviewed. No pertinent past medical history.  Past Surgical History:  History reviewed. No pertinent surgical history.  Gynecologic History: Patient's last menstrual period was 07/27/2020.  Obstetric History: [id number]  Family History:  History reviewed. No pertinent family history.  Social History:  Social History   Socioeconomic History   Marital status: Single    Spouse name: Not on file   Number of children: Not on file   Years of education: Not on file   Highest education level: Not on file  Occupational History   Not on file  Tobacco Use   Smoking status: Former    Types: Cigarettes   Smokeless tobacco: Never  Vaping Use   Vaping Use: Never used  Substance and Sexual Activity   Alcohol use: Not on file   Drug use: Not Currently    Types: Marijuana   Sexual activity: Yes  Other Topics Concern   Not on file  Social History Narrative   Not on file   Social Determinants of Health   Financial Resource Strain: Not on file  Food Insecurity: Not on file  Transportation Needs: Not on file  Physical Activity: Not on file  Stress: Not on file  Social Connections: Not on file  Intimate Partner Violence: Not on file    Allergies:  No Known Allergies  Medications: Prior to Admission medications   Medication Sig Start Date End Date Taking? Authorizing Provider  acetaminophen  (TYLENOL) 500 MG tablet Take 2 tablets (1,000 mg total) by mouth every 6 (six) hours as needed (for pain scale < 4). 05/13/21  Yes Alric Seton, MD  coconut oil OIL Apply 1 application topically as needed. 05/13/21  Yes Alric Seton, MD  ibuprofen (ADVIL) 600 MG tablet Take 1 tablet (600 mg total) by mouth every 6 (six) hours as needed. 05/13/21  Yes Alric Seton, MD  NIFEdipine (PROCARDIA-XL/NIFEDICAL-XL) 30 MG 24 hr tablet Take 1 tablet (30 mg total) by mouth daily. 05/13/21  Yes Venora Maples, MD  Prenat w/o A-FeCbGl-DSS-FA-DHA (CITRANATAL ASSURE) 35-1 & 300 MG tablet Take 1 tablet by mouth daily. 11/20/20  Yes Zipporah Plants, CNM    No physical exam done today due to virtual visit She reports BP on her home cuff is 133/87  Assessment: 30 y.o. G3P3003 blood pressure follow up  Plan: Problem List Items Addressed This Visit   None Visit Diagnoses     BP check    -  Primary       1) Blood pressure - blood pressure at today's visit is upper range of normal.  As a result patient will continue on current anti-hypertensive medication and return for BP check in 1 week.   I provided 10 minutes of non-face-to-face time during this encounter.  Return in about 1 week (around 05/28/2021) for MD visit for BP check and tubal planning.   Tresea Mall, CNM [facility] 05/21/2021, 2:28 PM

## 2021-05-21 NOTE — Telephone Encounter (Signed)
No answer. Left message to return nurse call.  Marcelino Duster [facility] 05/21/2021,1840

## 2021-06-18 ENCOUNTER — Ambulatory Visit (INDEPENDENT_AMBULATORY_CARE_PROVIDER_SITE_OTHER): Payer: Medicaid Other | Admitting: Obstetrics and Gynecology

## 2021-06-18 ENCOUNTER — Telehealth: Payer: Self-pay

## 2021-06-18 ENCOUNTER — Encounter: Payer: Self-pay | Admitting: Obstetrics and Gynecology

## 2021-06-18 ENCOUNTER — Other Ambulatory Visit: Payer: Self-pay

## 2021-06-18 DIAGNOSIS — R42 Dizziness and giddiness: Secondary | ICD-10-CM

## 2021-06-18 NOTE — Telephone Encounter (Signed)
-----   Message from Natale Milch, MD sent at 06/18/2021  3:20 PM EDT ----- Surgery Booking Request Patient Full Name:  Jodi Perez  MRN: [id number]  DOB: 15-Nov-1990  Surgeon: Natale Milch, MD  Requested Surgery Date and Time: ASAP Primary Diagnosis AND Code: Desires sterilization Secondary Diagnosis and Code:  Surgical Procedure: Robot assisted laparoscopic bilateral salpingectomy RNFA Requested?: Yes L&D Notification: No Admission Status: same day surgery Length of Surgery: 25 min Special Case Needs: No H&P: yes Phone Interview???:  Yes Interpreter: No Medical Clearance:  No Special Scheduling Instructions: No Any known health/anesthesia issues, diabetes, sleep apnea, latex allergy, defibrillator/pacemaker?: No Acuity: P2   (P1 highest, P2 delay may cause harm, P3 low, elective gyn, P4 lowest) Post op follow up visits: 1 week

## 2021-06-18 NOTE — Telephone Encounter (Signed)
Called patient to schedule Robot assisted laparoscopic bilateral salpingectomy w Jodi Perez 9/8  H&P 9/2 @ 1110   Pre-admit phone call appointment to be requested - date and time will be included on H&P paper work. Also all appointments will be updated on pt MyChart. Explained that this appointment has a call window. Based on the time scheduled will indicate if the call will be received within a 4 hour window before 1:00 or after.  Advised that pt may also receive calls from the hospital pharmacy and pre-service center.  Confirmed pt has Community education officer as Editor, commissioning (pt will upload to my chart) [employer] as secondary insurance.

## 2021-06-18 NOTE — Patient Instructions (Signed)
Laparoscopic Tubal Ligation Laparoscopic tubal ligation is a procedure to close the fallopian tubes. This is done to prevent pregnancy. When the fallopian tubes are closed, the eggs that your ovaries release cannot enter the uterus, and sperm cannot reach thereleased eggs. You should not have this procedure if you want to get pregnant someday or ifyou are unsure about having more children. Tell a health care provider about: Any allergies you have. All medicines you are taking, including vitamins, herbs, eye drops, creams, and over-the-counter medicines. Any problems you or family members have had with anesthetic medicines. Any blood disorders you have. Any surgeries you have had. Any medical conditions you have. Whether you are pregnant or may be pregnant. Any past pregnancies. What are the risks? Generally, this is a safe procedure. However, problems may occur, including: Infection. Bleeding. Injury to other organs in the abdomen. Side effects from anesthetic medicines. Failure of the procedure. This procedure can increase your risk of an ectopic pregnancy. This is apregnancy in which a fertilized egg attaches to the outside of the uterus. What happens before the procedure? Staying hydrated Follow instructions from your health care provider about hydration, which may include: Up to 2 hours before the procedure - you may continue to drink clear liquids, such as water, clear fruit juice, black coffee, and plain tea. Eating and drinking restrictions Follow instructions from your health care provider about eating and drinking, which may include: 8 hours before the procedure - stop eating heavy meals or foods, such as meat, fried foods, or fatty foods. 6 hours before the procedure - stop eating light meals or foods, such as toast or cereal. 6 hours before the procedure - stop drinking milk or drinks that contain milk. 2 hours before the procedure - stop drinking clear  liquids. Medicines Ask your health care provider about: Changing or stopping your regular medicines. This is especially important if you are taking diabetes medicines or blood thinners. Taking medicines such as aspirin and ibuprofen. These medicines can thin your blood. Do not take these medicines unless your health care provider tells you to take them. Taking over-the-counter medicines, vitamins, herbs, and supplements. Surgery safety Ask your health care provider: How your surgery site will be marked. What steps will be taken to help prevent infection. These steps may include: Removing hair at the surgery site. Washing skin with a germ-killing soap. Taking antibiotic medicine. General instructions Do not use any products that contain nicotine or tobacco for at least 4 weeks before the procedure. These products include cigarettes, chewing tobacco, and vaping devices, such as e-cigarettes. If you need help quitting, ask your health care provider. Plan to have someone take you home from the hospital. If you will be going home right after the procedure, plan to have a responsible adult care for you for the time you are told. This is important. What happens during the procedure?     An IV will be inserted into one of your veins. You will be given one or more of the following: A medicine to help you relax (sedative). A medicine to numb the area (local anesthetic). A medicine to make you fall asleep (general anesthetic). A medicine that is injected into an area of your body to numb everything below the injection site (regional anesthetic). Your bladder may be emptied with a small tube (catheter). If you have been given a general anesthetic, a tube will be put down your throat to help you breathe. Two small incisions will be made in   your lower abdomen and near your belly button. Your abdomen will be inflated with a gas. This will let the surgeon see better and will give the surgeon room to  work. A lighted tube with camera (laparoscope) will be inserted into your abdomen through one of the incisions. Small instruments will be inserted through the other incision. The fallopian tubes will be tied off, burned (cauterized), or blocked with a clip, ring, or clamp. A small portion in the center of each fallopian tube may be removed. The gas will be released from the abdomen. The incisions will be closed with stitches (sutures). A bandage (dressing) will be placed over the incisions. The procedure may vary among health care providers and hospitals. What happens after the procedure? Your blood pressure, heart rate, breathing rate, and blood oxygen level will be monitored until you leave the hospital. You will be given medicine to help with pain, nausea, and vomiting as needed. You may have vaginal discharge after the procedure. You may need to wear a sanitary napkin. If you were given a sedative during the procedure, it can affect you for several hours. Do not drive or operate machinery until your health care provider says that it is safe. Summary Laparoscopic tubal ligation is a procedure that is done to prevent pregnancy. You should not have this procedure if you want to get pregnant someday or if you are unsure about having more children. The procedure is done using a thin, lighted tube (laparoscope) with a camera attached that will be inserted into your abdomen through an incision. After the procedure you will be given medicine to help with pain, nausea, and vomiting as needed. Plan to have someone take you home from the hospital. This information is not intended to replace advice given to you by your health care provider. Make sure you discuss any questions you have with your healthcare provider. Document Revised: 06/27/2020 Document Reviewed: 06/27/2020 Elsevier Patient Education  2022 [employer] Inc.  

## 2021-06-25 ENCOUNTER — Other Ambulatory Visit: Payer: Self-pay

## 2021-06-25 ENCOUNTER — Encounter
Admission: RE | Admit: 2021-06-25 | Discharge: 2021-06-25 | Disposition: A | Discharge status: Home / Self Care | Payer: Medicaid Other | Source: Ambulatory Visit | Attending: Obstetrics and Gynecology | Admitting: Obstetrics and Gynecology

## 2021-06-25 HISTORY — DX: Essential (primary) hypertension: I10

## 2021-06-25 NOTE — Patient Instructions (Signed)
Your procedure is scheduled on: 07/02/21 Report to DAY SURGERY DEPARTMENT LOCATED ON 2ND FLOOR MEDICAL MALL ENTRANCE. To find out your arrival time please call [phone] between 1PM - 3PM on 07/01/21.  Remember: Instructions that are not followed completely may result in serious medical risk, up to and including death, or upon the discretion of your surgeon and anesthesiologist your surgery may need to be rescheduled.     _X__ 1. Do not eat food after midnight the night before your procedure.                 No gum chewing or hard candies. You may drink clear liquids up to 2 hours                 before you are scheduled to arrive for your surgery- DO not drink clear                 liquids within 2 hours of the start of your surgery.                 Clear Liquids include:  water, apple juice without pulp, clear carbohydrate                 drink such as Clearfast or Gatorade, Black Coffee or Tea (Do not add                 anything to coffee or tea). Diabetics water only  The Ensure "clear" Pre Surgery drink needs to be finished 2 hours prior to arrival  __X__2.  On the morning of surgery brush your teeth with toothpaste and water, you                 may rinse your mouth with mouthwash if you wish.  Do not swallow any              toothpaste of mouthwash.     _X__ 3.  No Alcohol for 24 hours before or after surgery.   _X__ 4.  Do Not Smoke or use e-cigarettes For 24 Hours Prior to Your Surgery.                 Do not use any chewable tobacco products for at least 6 hours prior to                 surgery.  ____  5.  Bring all medications with you on the day of surgery if instructed.   __X__  6.  Notify your doctor if there is any change in your medical condition      (cold, fever, infections).     Do not wear jewelry, make-up, hairpins, clips or nail polish. Do not wear lotions, powders, or perfumes.  Do not shave 48 hours prior to surgery. Men may shave face and neck. Do not bring  valuables to the hospital.    [facility] is not responsible for any belongings or valuables.  Contacts, dentures/partials or body piercings may not be worn into surgery. Bring a case for your contacts, glasses or hearing aids, a denture cup will be supplied. Leave your suitcase in the car. After surgery it may be brought to your room. For patients admitted to the hospital, discharge time is determined by your treatment team.   Patients discharged the day of surgery will not be allowed to drive home.   Please read over the following fact sheets that you were given:   CHG soap  __X__ Take these medicines the morning of surgery with A SIP OF WATER:    1. none  2.   3.   4.  5.  6.  ____ Fleet Enema (as directed)   __X__ Use CHG Soap/SAGE wipes as directed  ____ Use inhalers on the day of surgery  ____ Stop metformin/Janumet/Farxiga 2 days prior to surgery    ____ Take 1/2 of usual insulin dose the night before surgery. No insulin the morning          of surgery.   ____ Stop Blood Thinners Coumadin/Plavix/Xarelto/Pleta/Pradaxa/Eliquis/Effient/Aspirin  on   Or contact your Surgeon, Cardiologist or Medical Doctor regarding  ability to stop your blood thinners  __X__ Stop Anti-inflammatories 7 days before surgery such as Advil, Ibuprofen, Motrin,  BC or Goodies Powder, Naprosyn, Naproxen, Aleve, Aspirin    __X__ Stop all herbal supplements, fish oil or vitamin E until after surgery.    ____ Bring C-Pap to the hospital.

## 2021-06-26 ENCOUNTER — Encounter: Payer: Self-pay | Admitting: Urgent Care

## 2021-06-26 ENCOUNTER — Encounter
Admission: RE | Admit: 2021-06-26 | Discharge: 2021-06-26 | Disposition: A | Discharge status: Home / Self Care | Payer: Medicaid Other | Source: Ambulatory Visit | Attending: Obstetrics and Gynecology | Admitting: Obstetrics and Gynecology

## 2021-06-26 ENCOUNTER — Ambulatory Visit (INDEPENDENT_AMBULATORY_CARE_PROVIDER_SITE_OTHER): Payer: Medicaid Other | Admitting: Obstetrics and Gynecology

## 2021-06-26 ENCOUNTER — Encounter: Payer: Self-pay | Admitting: Obstetrics and Gynecology

## 2021-06-26 VITALS — BP 116/70 | Ht 65.0 in | Wt 186.6 lb

## 2021-06-26 DIAGNOSIS — Z3009 Encounter for other general counseling and advice on contraception: Secondary | ICD-10-CM

## 2021-06-26 DIAGNOSIS — Z01818 Encounter for other preprocedural examination: Secondary | ICD-10-CM | POA: Diagnosis present

## 2021-06-26 DIAGNOSIS — Z01810 Encounter for preprocedural cardiovascular examination: Secondary | ICD-10-CM

## 2021-06-26 LAB — CBC
HCT: 33.3 % — ABNORMAL LOW (ref 36.0–46.0)
Hemoglobin: 11.3 g/dL — ABNORMAL LOW (ref 12.0–15.0)
MCH: 30 pg (ref 26.0–34.0)
MCHC: 33.9 g/dL (ref 30.0–36.0)
MCV: 88.3 fL (ref 80.0–100.0)
Platelets: 330 10*3/uL (ref 150–400)
RBC: 3.77 MIL/uL — ABNORMAL LOW (ref 3.87–5.11)
RDW: 15.1 % (ref 11.5–15.5)
WBC: 5.1 10*3/uL (ref 4.0–10.5)
nRBC: 0 % (ref 0.0–0.2)

## 2021-06-26 LAB — TYPE AND SCREEN
ABO/RH(D): O POS
Antibody Screen: NEGATIVE
Extend sample reason: UNDETERMINED

## 2021-06-26 NOTE — Progress Notes (Signed)
Patient ID: Jodi Perez, female   DOB: 1991/08/19, 30 y.o.   MRN: [id number]  Reason for Consult: Gynecologic Exam   Referred by [facility],*  Subjective:     HPI:  Jodi Perez is a 30 y.o. female she presents today for her preop visit.  She has no new complaints.  She reports that she still desires to be sterilized.  Gynecological History  Patient's last menstrual period was 06/22/2021 (approximate).  Past Medical History:  Diagnosis Date   Hypertension    IN PREGNANCY   History reviewed. No pertinent family history. History reviewed. No pertinent surgical history.  Short Social History:  Social History   Tobacco Use   Smoking status: Former    Types: Cigarettes   Smokeless tobacco: Never  Substance Use Topics   Alcohol use: Yes    Comment: OCC    No Known Allergies  Current Outpatient Medications  Medication Sig Dispense Refill   acetaminophen (TYLENOL) 500 MG tablet Take 2 tablets (1,000 mg total) by mouth every 6 (six) hours as needed (for pain scale < 4).     coconut oil OIL Apply 1 application topically as needed.  0   ibuprofen (ADVIL) 600 MG tablet Take 1 tablet (600 mg total) by mouth every 6 (six) hours as needed. (Patient not taking: Reported on 06/19/2021) 30 tablet 0   NIFEdipine (PROCARDIA-XL/NIFEDICAL-XL) 30 MG 24 hr tablet Take 1 tablet (30 mg total) by mouth daily. (Patient not taking: Reported on 06/19/2021) 30 tablet 5   Prenat w/o A-FeCbGl-DSS-FA-DHA (CITRANATAL ASSURE) 35-1 & 300 MG tablet Take 1 tablet by mouth daily. (Patient not taking: No sig reported) 60 each 3   No current facility-administered medications for this visit.    Review of Systems  Constitutional: Negative for chills, fatigue, fever and unexpected weight change.  HENT: Negative for trouble swallowing.  Eyes: Negative for loss of vision.  Respiratory: Negative for cough, shortness of breath and wheezing.  Cardiovascular: Negative for chest pain, leg  swelling, palpitations and syncope.  GI: Negative for abdominal pain, blood in stool, diarrhea, nausea and vomiting.  GU: Negative for difficulty urinating, dysuria, frequency and hematuria.  Musculoskeletal: Negative for back pain, leg pain and joint pain.  Skin: Negative for rash.  Neurological: Negative for dizziness, headaches, light-headedness, numbness and seizures.  Psychiatric: Negative for behavioral problem, confusion, depressed mood and sleep disturbance.       Objective:  Objective   Vitals:   06/26/21 1115  BP: 116/70  Weight: 186 lb 9.6 oz (84.6 kg)  Height: 5\' 5"  (1.651 m)   Body mass index is 31.05 kg/m.  Physical Exam Vitals and nursing note reviewed. Exam conducted with a chaperone present.  Constitutional:      Appearance: Normal appearance.  HENT:     Head: Normocephalic and atraumatic.  Eyes:     Extraocular Movements: Extraocular movements intact.     Pupils: Pupils are equal, round, and reactive to light.  Cardiovascular:     Rate and Rhythm: Normal rate and regular rhythm.  Pulmonary:     Effort: Pulmonary effort is normal.     Breath sounds: Normal breath sounds.  Abdominal:     General: Abdomen is flat.     Palpations: Abdomen is soft.  Musculoskeletal:     Cervical back: Normal range of motion.  Skin:    General: Skin is warm and dry.  Neurological:     General: No focal deficit present.  Mental Status: She is alert and oriented to person, place, and time.  Psychiatric:        Behavior: Behavior normal.        Thought Content: Thought content normal.        Judgment: Judgment normal.    Assessment/Plan:      30 y.o. [id number]  Patient reports that she desires sterilization. She feels strongly that she does not desire children in the future.   We discussed alternative options for sterilization including long-acting reversible contraception methods. We discussed that sterilization procedures have a failure rate of approximately 1 in   1000.  We discussed potential surgical complications of sterile sterilization including infection damage to surrounding pelvic tissues and risk of bleeding.We discussed that sterilization procedure should be considered on reversible.  Having a reversal surgery to correct a tubal ligation is expensive, risks and ectopic pregnancy, and has high failure rates.  We discussed options of performing a tubal ligation including removing a segment of the fallopian tube or removing an entire fallopian tube.  We discussed that removal of entire fallopian tube has been associated with a reduction in ovarian cancer risk, however this reduction in is small in her lifetime risk of ovarian cancer is approximately 1 in 100.  If her whole fallopian tube is removed she does not have the option of a reversal surgery in the future.  Future pregnancies would have to be obtained through IVF which is expensive.  Patient feels sure of her decision to have a sterilization procedure.  She desires to have a laparoscopic bilateral salpingectomy.    We reviewed her postoperative care plan and expectations for surgical recovery.  Adelene Idler MD, [facility], [facility] 06/26/2021 11:27 AM

## 2021-06-26 NOTE — Patient Instructions (Signed)
Salpingectomy, Care After The following information offers guidance on how to care for yourself after your procedure. Your health care provider may also give you more specific instructions. If you have problems or questions, contact your health care provider. What can I expect after the procedure? After the procedure, it is common to have: Pain in your abdomen. Light vaginal bleeding (spotting) for a few days. Tiredness. Your recovery time will depend on which method was used for your surgery. Follow these instructions at home: Medicines Take over-the-counter and prescription medicines only as told by your health care provider. Ask your health care provider if the medicine prescribed to you: Requires you to avoid driving or using machinery. Can cause constipation. You may need to take actions to prevent or treat constipation, such as: Drink enough fluid to keep your urine pale yellow. Take over-the-counter or prescription medicines. Eat foods that are high in fiber, such as beans, whole grains, and fresh fruits and vegetables. Limit foods that are high in fat and processed sugars, such as fried or sweet foods. Incision care  Follow instructions from your health care provider about how to take care of your incision or incisions. Make sure you: Wash your hands with soap and water for at least 20 seconds before and after you change your bandage (dressing). If soap and water are not available, use hand sanitizer. Change or remove your dressing as told by your health care provider. Leave stitches (sutures), skin glue, staples, or adhesive strips in place. These skin closures may need to stay in place for 2 weeks or longer. If adhesive strip edges start to loosen and curl up, you may trim the loose edges. Do not remove adhesive strips completely unless your health care provider tells you to do that. Keep your dressing clean and dry. Check your incision area every day for signs of infection. Check  for: Redness, swelling, or pain that gets worse. Fluid or blood. Warmth. Pus or a bad smell. Activity Rest as told by your health care provider. Avoid sitting for a long time without moving. Get up to take short walks every 1-2 hours. This is important to improve blood flow and breathing. Ask for help if you feel weak or unsteady. Return to your normal activities as told by your health care provider. Ask your health care provider what activities are safe for you. Do not drive until your health care provider says that it is safe. Do not lift anything that is heavier than 10 lb (4.5 kg), or the limit that you are told, until your health care provider says that it is safe. This may last for 2-6 weeks depending on your surgery. Do not douche, use tampons, or have sex until your health care provider approves. General instructions Do not use any products that contain nicotine or tobacco. These products include cigarettes, chewing tobacco, and vaping devices, such as e-cigarettes. These can delay healing after surgery. If you need help quitting, ask your health care provider. Wear compression stockings as told by your health care provider. These stockings help to prevent blood clots and reduce swelling in your legs. Do not take baths, swim, or use a hot tub until your health care provider approves. You may take showers. Keep all follow-up visits. This is important. Contact a health care provider if: You have pain when you urinate. You have redness, swelling, or more pain around an incision or an incision feels warm to the touch. You have pus, fluid, blood, or a bad smell  coming from an incision or an incision starts to open. You have a fever. You have abdominal pain that gets worse or does not get better with medicine. You have a rash. You feel light-headed, have nausea and vomiting, or both. Get help right away if: You have pain in your chest or leg. You develop shortness of breath. You  faint. You have increased or heavy vaginal bleeding, such as soaking a sanitary napkin in an hour. These symptoms may represent a serious problem that is an emergency. Do not wait to see if the symptoms will go away. Get medical help right away. Call your local emergency services (911 in the [place] Do not drive yourself to the hospital. Summary After the procedure, it is common to feel tired, have pain in your abdomen, and have light vaginal bleeding for a few days. Follow instructions from your health care provider about how to take care of your incision or incisions. Return to your normal activities as told by your health care provider. Ask your health care provider what activities are safe for you. Do not douche, use tampons, or have sex until your health care provider approves. Keep all follow-up visits. This is important. This information is not intended to replace advice given to you by your health care provider. Make sure you discuss any questions you have with your health care provider. Document Revised: 09/02/2020 Document Reviewed: 09/02/2020 Elsevier Patient Education  2022 [employer] Salpingectomy Salpingectomy, also called tubectomy, is the surgical removal of one of the fallopian tubes. The fallopian tubes allow eggs to travel from the ovaries to the uterus. Removing one fallopian tube does not prevent pregnancy. It also does not cause problems with your menstrual periods. You may need this procedure if you: Have an ectopic pregnancy. This is when a fertilized egg attaches to the fallopian tube instead of the uterus. An ectopic pregnancy can cause the tube to burst or tear (rupture). Have an infected fallopian tube. Have cancer of the fallopian tube or nearby organs. Have had an ovary removed due to a cyst or tumor. Have had your uterus removed. Are at high risk for ovarian cancer. There are three different methods that can be used for a salpingectomy: An open method in which  one large incision is made in your abdomen. A laparoscopic method in which a thin, lighted tube with a tiny camera (laparoscope) is used to help perform the procedure. The laparoscope allows a surgeon to make several small incisions in the abdomen instead of one large incision. A robot-assisted method in which a computer is used to control surgical instruments that are attached to robotic arms. Tell a health care provider about: Any allergies you have. All medicines you are taking, including vitamins, herbs, eye drops, creams, and over-the-counter medicines. Any problems you or family members have had with anesthetic medicines. Any blood disorders you have. Any surgeries you have had. Any medical conditions you have. Whether you are pregnant or may be pregnant. What are the risks? Generally, this is a safe procedure. However, problems may occur, including: Infection. Bleeding. Allergic reactions to medicines. Blood clots in the legs or lungs. Damage to nearby structures or organs. What happens before the procedure? Staying hydrated Follow instructions from your health care provider about hydration, which may include: Up to 2 hours before the procedure - you may continue to drink clear liquids, such as water, clear fruit juice, black coffee, and plain tea. Eating and drinking restrictions Follow instructions from your health care  provider about eating and drinking, which may include: 8 hours before the procedure - stop eating heavy meals or foods, such as meat, fried foods, or fatty foods. 6 hours before the procedure - stop eating light meals or foods, such as toast or cereal. 6 hours before the procedure - stop drinking milk or drinks that contain milk. 2 hours before the procedure - stop drinking clear liquids. Medicines Ask your health care provider about: Changing or stopping your regular medicines. This is especially important if you are taking diabetes medicines or blood  thinners. Taking medicines such as aspirin and ibuprofen. These medicines can thin your blood. Do not take these medicines unless your health care provider tells you to take them. Taking over-the-counter medicines, vitamins, herbs, and supplements. General instructions Do not use any products that contain nicotine or tobacco for at least 4 weeks before the procedure. These products include cigarettes, chewing tobacco, and vaping devices, such as e-cigarettes. If you need help quitting, ask your health care provider. You may have an exam or tests, such as an electrocardiogram (ECG) or a blood or urine test. Ask your health care provider: How your surgery site will be marked. What steps will be taken to help prevent infection. These steps may include: Removing hair at the surgery site. Washing skin with a germ-killing soap. Taking antibiotic medicine. Plan to have a responsible adult take you home from the hospital or clinic. If you will be going home right after the procedure, plan to have a responsible adult care for you for the time you are told. This is important. What happens during the procedure? An IV will be inserted into one of your veins. You will be given one or both of the following: A medicine to help you relax (sedative). A medicine to make you fall asleep (general anesthetic). A small, thin tube (catheter) may be inserted through your urethra and into your bladder. This will drain urine during your procedure. Depending on the type of procedure you are having, one incision or several small incisions will be made in your abdomen. Your fallopian tube and ovary will be cut away from the uterus and removed. Your blood vessels will be clamped and tied to prevent excess bleeding. The incision or incisions in your abdomen will be closed with stitches (sutures), staples, or skin glue. A bandage (dressing) may be placed over your incision or incisions. The procedure may vary among health  care providers and hospitals. What happens after the procedure?  Your blood pressure, heart rate, breathing rate, and blood oxygen level will be monitored until you leave the hospital or clinic. You may continue to receive fluids and medicines through an IV. You may continue to have a catheter draining your urine. You may have to wear compression stockings. These stockings help to prevent blood clots and reduce swelling in your legs. You will be given pain medicine as needed. If you were given a sedative during the procedure, it can affect you for several hours. Do not drive or operate machinery until your health care provider says that it is safe. Summary Salpingectomy is a surgical procedure to remove one of the fallopian tubes. The procedure may be done with an open incision, a thin, lighted tube with a tiny camera (laparoscope), or computer-controlled instruments. Depending on the type of procedure you have, one incision or several small incisions will be made in your abdomen. Your blood pressure, heart rate, breathing rate, and blood oxygen level will be monitored until you  leave the hospital or clinic. Plan to have a responsible adult take you home from the hospital or clinic. This information is not intended to replace advice given to you by your health care provider. Make sure you discuss any questions you have with your health care provider. Document Revised: 09/02/2020 Document Reviewed: 09/02/2020 Elsevier Patient Education  2022 [employer].

## 2021-07-03 ENCOUNTER — Encounter (HOSPITAL_COMMUNITY): Payer: Self-pay | Admitting: Urgent Care

## 2021-07-09 ENCOUNTER — Ambulatory Visit: Payer: Medicaid Other | Admitting: Obstetrics and Gynecology

## 2021-07-09 ENCOUNTER — Telehealth: Payer: Self-pay

## 2021-07-09 NOTE — Telephone Encounter (Signed)
FMLA/DISABILITY form for [employer] filled out; well obtain signature.  Will process once our forms are filled out and pmt received.

## 2021-07-13 NOTE — Progress Notes (Signed)
Postpartum Visit  Chief Complaint:  Chief Complaint  Patient presents with   Postpartum Care    History of Present Illness: Patient is a 30 y.o. [id number] presents for postpartum visit.  Date of delivery: 05/11/2021 Type of delivery: vaginal Laceration: 2nd degree  Pregnancy or labor problems: no  Lochia:  normale Post partum depression/anxiety noted:  no Edinburgh Post-Partum Depression Score: 11  Date of last PAP: 09/2020 NIL  normal   Any problems since the delivery:  none  She reports she still desires sterilization. She has been feeling dizzy since her delivery.   Newborn Details:  SINGLETON :  1. Infant Status: Infant doing well at home with mother.   Review of Systems: Review of Systems  Constitutional:  Negative for chills, fever, malaise/fatigue and weight loss.  HENT:  Negative for congestion, hearing loss and sinus pain.   Eyes:  Negative for blurred vision and double vision.  Respiratory:  Negative for cough, sputum production, shortness of breath and wheezing.   Cardiovascular:  Negative for chest pain, palpitations, orthopnea and leg swelling.  Gastrointestinal:  Negative for abdominal pain, constipation, diarrhea, nausea and vomiting.  Genitourinary:  Negative for dysuria, flank pain, frequency, hematuria and urgency.  Musculoskeletal:  Negative for back pain, falls and joint pain.  Skin:  Negative for itching and rash.  Neurological:  Positive for dizziness. Negative for headaches.  Psychiatric/Behavioral:  Negative for depression, substance abuse and suicidal ideas. The patient is not nervous/anxious.    Past Medical History:  Past Medical History:  Diagnosis Date   Hypertension    IN PREGNANCY    Past Surgical History:  History reviewed. No pertinent surgical history.  Family History:  History reviewed. No pertinent family history.  Social History:  Social History   Socioeconomic History   Marital status: Single    Spouse name: Not on file    Number of children: Not on file   Years of education: Not on file   Highest education level: Not on file  Occupational History   Not on file  Tobacco Use   Smoking status: Former    Types: Cigarettes   Smokeless tobacco: Never  Vaping Use   Vaping Use: Never used  Substance and Sexual Activity   Alcohol use: Yes    Comment: OCC   Drug use: Not Currently    Types: Marijuana   Sexual activity: Yes  Other Topics Concern   Not on file  Social History Narrative   Not on file   Social Determinants of Health   Financial Resource Strain: Not on file  Food Insecurity: Not on file  Transportation Needs: Not on file  Physical Activity: Not on file  Stress: Not on file  Social Connections: Not on file  Intimate Partner Violence: Not on file    Allergies:  No Known Allergies  Medications: Prior to Admission medications   Medication Sig Start Date End Date Taking? Authorizing Provider  acetaminophen (TYLENOL) 500 MG tablet Take 2 tablets (1,000 mg total) by mouth every 6 (six) hours as needed (for pain scale < 4). 05/13/21  Yes Alric Seton, MD  coconut oil OIL Apply 1 application topically as needed. 05/13/21  Yes Alric Seton, MD  ibuprofen (ADVIL) 600 MG tablet Take 1 tablet (600 mg total) by mouth every 6 (six) hours as needed. Patient not taking: Reported on 06/19/2021 05/13/21  Yes Alric Seton, MD  NIFEdipine (PROCARDIA-XL/NIFEDICAL-XL) 30 MG 24 hr tablet Take 1 tablet (30 mg total)  by mouth daily. Patient not taking: Reported on 06/19/2021 05/13/21  Yes Venora Maples, MD  Prenat w/o A-[id number]-[id number] (CITRANATAL ASSURE) 35-1 & 300 MG tablet Take 1 tablet by mouth daily. Patient not taking: No sig reported 11/20/20  Yes Zipporah Plants, CNM    Physical Exam Vitals:  Vitals:   06/18/21 1444  BP: 120/72    Physical Exam Constitutional:      Appearance: Normal appearance. She is well-developed.  HENT:     Head: Normocephalic and atraumatic.   Eyes:     Extraocular Movements: Extraocular movements intact.     Pupils: Pupils are equal, round, and reactive to light.  Neck:     Thyroid: No thyromegaly.  Cardiovascular:     Rate and Rhythm: Normal rate and regular rhythm.     Heart sounds: Normal heart sounds.  Pulmonary:     Effort: Pulmonary effort is normal.     Breath sounds: Normal breath sounds.  Abdominal:     General: Bowel sounds are normal. There is no distension.     Palpations: Abdomen is soft. There is no mass.  Musculoskeletal:     Cervical back: Neck supple.  Neurological:     Mental Status: She is alert and oriented to person, place, and time.  Skin:    General: Skin is warm and dry.  Psychiatric:        Behavior: Behavior normal.        Thought Content: Thought content normal.        Judgment: Judgment normal.  Vitals reviewed.    Assessment: 30 y.o. [id number] presenting for 6 week postpartum visit  Plan: Problem List Items Addressed This Visit   None Visit Diagnoses     Dizziness    -  Primary   Relevant Orders   Ferritin   B12   Iron and TIBC   CBC With Differential       1) Contraception-  Desires sterilization- suregery request sent  2)  Pap: up to date  3) Patient underwent screening for postpartum depression with no concerns noted.  4) Labs for anemia today   Follow up as needed  Adelene Idler MD, [facility], [facility] 07/13/2021 6:06 PM

## 2021-08-11 ENCOUNTER — Encounter: Payer: Self-pay | Admitting: Urgent Care

## 2021-08-11 ENCOUNTER — Other Ambulatory Visit: Admission: RE | Admit: 2021-08-11 | Payer: Medicaid Other | Source: Ambulatory Visit

## 2021-08-13 ENCOUNTER — Encounter: Admission: RE | Payer: Self-pay | Source: Ambulatory Visit

## 2021-08-13 ENCOUNTER — Ambulatory Visit
Admission: RE | Admit: 2021-08-13 | Payer: Medicaid Other | Source: Ambulatory Visit | Admitting: Obstetrics and Gynecology

## 2021-08-13 SURGERY — SALPINGECTOMY, ROBOT-ASSISTED
Anesthesia: Choice | Laterality: Bilateral

## 2021-08-20 ENCOUNTER — Ambulatory Visit: Payer: Medicaid Other | Admitting: Obstetrics and Gynecology

## 2021-08-27 ENCOUNTER — Ambulatory Visit: Payer: Medicaid Other | Admitting: Obstetrics and Gynecology

## 2022-02-03 ENCOUNTER — Encounter: Payer: Self-pay | Admitting: Obstetrics and Gynecology

## 2022-02-03 ENCOUNTER — Ambulatory Visit: Payer: Medicaid Other | Admitting: Obstetrics and Gynecology

## 2022-02-03 VITALS — BP 122/70 | Ht 65.0 in | Wt 230.0 lb

## 2022-02-03 DIAGNOSIS — Z3009 Encounter for other general counseling and advice on contraception: Secondary | ICD-10-CM | POA: Diagnosis not present

## 2022-02-03 DIAGNOSIS — N926 Irregular menstruation, unspecified: Secondary | ICD-10-CM

## 2022-02-03 DIAGNOSIS — Z331 Pregnant state, incidental: Secondary | ICD-10-CM

## 2022-02-03 LAB — POCT URINE PREGNANCY: Preg Test, Ur: POSITIVE — AB

## 2022-02-03 NOTE — Progress Notes (Signed)
31 yo P3 with LMP 12/23/21 and BMI 38 presenting today for sterilization consultation. Patient is also requesting a pregnancy test. She reports a monthly period without any other complaints. Patient denies pelvic pain or abnormal discharge. She is currently sexually active without contraception. She is interested in birth control pills until her sterilization procedure ? ?Past Medical History:  ?Diagnosis Date  ? Hypertension   ? IN PREGNANCY  ? ?History reviewed. No pertinent surgical history. ?History reviewed. No pertinent family history. ?Social History  ? ?Tobacco Use  ? Smoking status: Former  ?  Types: Cigarettes  ? Smokeless tobacco: Never  ?Vaping Use  ? Vaping Use: Never used  ?Substance Use Topics  ? Alcohol use: Yes  ?  Comment: OCC  ? Drug use: Not Currently  ?  Types: Marijuana  ? ?ROS ?See pertinent in HPI. All other systems reviewed and non contributory ?Blood pressure 122/70, height 5\' 5"  (1.651 m), weight 230 lb (104.3 kg), currently breastfeeding. ?GENERAL: Well-developed, well-nourished female in no acute distress.  ?NEURO: alert and oriented x 3 ? ?Urine pregnancy test- positive ? ?A/P 31 yo here for sterilization consultation and pregnancy as incidental finding ?- Other reversible forms of contraception were discussed with patient; she declines all other modalities.   ?- Risks of procedure discussed with patient including permanence of method, bleeding, infection, injury to surrounding organs and need for additional procedures including laparotomy, risk of regret.  Failure risk of 0.5-1% with increased risk of ectopic gestation if pregnancy occurs was also discussed with patient.   ?- Patient desires bilateral salpingectomy ?- Urine pregnancy test was positive. Patient was informed of these results by phone as she was in a hurry to return to work. Patient will contact [phone] regarding starting prenatal care or BTL ?- Patient left without signing BTL form ? ?

## 2022-09-02 ENCOUNTER — Telehealth: Payer: Self-pay

## 2022-09-02 NOTE — Telephone Encounter (Signed)
Pt needs a [facility] consult . Please schedule

## 2022-09-29 ENCOUNTER — Encounter: Payer: Medicaid Other | Admitting: Obstetrics and Gynecology

## 2022-10-22 ENCOUNTER — Ambulatory Visit: Payer: Medicaid Other | Admitting: Obstetrics and Gynecology

## 2022-10-22 ENCOUNTER — Telehealth: Payer: Self-pay | Admitting: Obstetrics and Gynecology

## 2022-10-22 VITALS — BP 117/87 | HR 94 | Resp 16 | Ht 66.0 in | Wt 222.3 lb

## 2022-10-22 DIAGNOSIS — Z308 Encounter for other contraceptive management: Secondary | ICD-10-CM | POA: Diagnosis not present

## 2022-10-22 DIAGNOSIS — Z01818 Encounter for other preprocedural examination: Secondary | ICD-10-CM | POA: Diagnosis not present

## 2022-10-22 NOTE — Progress Notes (Unsigned)
    GYNECOLOGY PROGRESS NOTE  Subjective:    Patient ID: Jodi Perez, female    DOB: 04/05/1991, 31 y.o.   MRN: [id number]  HPI  Patient is a 31 y.o. G21P3003 female who presents for consultation for bilateral tubal ligation. She does not desire to have any more children. Of note, patient was seen last June for consultation for tubal ligation. Notes that at pre-op appointment she was discovered to be pregnant.  Patient notes that she decided to terminate that pregnancy.  Currently she does not use anything for contraception. LMP 09/29/2022.    The following portions of the patient's history were reviewed and updated as appropriate:   She  has a past medical history of Hypertension.  She  has a past surgical history that includes No past surgeries.  Her family history is not on file.  She  reports that she has quit smoking. Her smoking use included cigarettes. She has never used smokeless tobacco. She reports current alcohol use. She reports that she does not currently use drugs after having used the following drugs: Marijuana.  Current Outpatient Medications on File Prior to Visit  Medication Sig Dispense Refill   acetaminophen (TYLENOL) 500 MG tablet Take 2 tablets (1,000 mg total) by mouth every 6 (six) hours as needed (for pain scale < 4).     coconut oil OIL Apply 1 application topically as needed.  0   ibuprofen (ADVIL) 600 MG tablet Take 1 tablet (600 mg total) by mouth every 6 (six) hours as needed. 30 tablet 0   No current facility-administered medications on file prior to visit.   She has No Known Allergies..  Review of Systems Pertinent items noted in HPI and remainder of comprehensive ROS otherwise negative.   Objective:   Blood pressure 117/87, pulse 94, resp. rate 16, height 5\' 6"  (1.676 m), weight 222 lb 4.8 oz (100.8 kg), last menstrual period 09/29/2022, not currently breastfeeding.  Body mass index is 35.88 kg/m. General appearance: alert and no distress See  H&P for details of exam.    Assessment:   1. Pre-operative evaluation for tubal ligation   2. Encounter for other contraceptive management      Plan:   - Patient desires permanent sterilization.  Other reversible forms of contraception were again briefly discussed with patient; she declines all other modalities. Risks of procedure discussed with patient including but not limited to: risk of regret, permanence of method, bleeding, infection, injury to surrounding organs and need for additional procedures.  Failure risk of about 1% with increased risk of ectopic gestation if pregnancy occurs was also discussed with patient.  Also discussed possibility of post-tubal pain syndrome. Patient verbalized understanding of these risks and wants to proceed with sterilization.  Medicaid sterilization form completed today, discussed that form must be compliant for 30 days prior to surgery. Will schedule surgery for 11/22/2021.   - Patient desires to try something for contraception until her surgery.  Given sample of Slynd. To begin with next cycle.    11/24/2021, MD [facility]

## 2022-10-22 NOTE — Telephone Encounter (Signed)
Reached out to pt to schedule 6 week post op appointment per Dr. Valentino Saxon.  Appt should be around 12/03/2022.  Left message for pt to call back.

## 2022-10-23 ENCOUNTER — Encounter: Payer: Self-pay | Admitting: Obstetrics and Gynecology

## 2022-10-24 ENCOUNTER — Encounter: Payer: Self-pay | Admitting: Obstetrics and Gynecology

## 2022-10-24 NOTE — H&P (Signed)
GYNECOLOGY PREOPERATIVE HISTORY AND PHYSICAL   Subjective:  Jodi Perez is a 31 y.o. G3P3003 here for surgical management of undesired fertility.   No significant preoperative concerns.  Proposed surgery: Laparoscopic Bilateral Tubal Ligation   Pertinent Gynecological History: Menses: regular every month without intermenstrual spotting Contraception:  none previously, used Slynd x 1 month preoperatively Last pap: NILM, neg HPV Date: 10/07/2020   Past Medical History:  Diagnosis Date   Hypertension    IN PREGNANCY   Past Surgical History:  Procedure Laterality Date   NO PAST SURGERIES     OB History  Gravida Para Term Preterm AB Living  3 3 3     3   SAB IAB Ectopic Multiple Live Births        0 1    # Outcome Date GA Lbr Len/2nd Weight Sex Delivery Anes PTL Lv  3 Term 05/11/21 [redacted]w[redacted]d 22:33 / 00:08 6 lb 11.9 oz (3.059 kg) F Vag-Spont EPI  LIV  2 Term 2012   6 lb (2.722 kg) F Vag-Spont     1 Term 2010   5 lb (2.268 kg) M Vag-Spont       No family history on file.  Social History   Socioeconomic History   Marital status: Single    Spouse name: Not on file   Number of children: Not on file   Years of education: Not on file   Highest education level: Not on file  Occupational History   Not on file  Tobacco Use   Smoking status: Former    Types: Cigarettes   Smokeless tobacco: Never  Vaping Use   Vaping Use: Never used  Substance and Sexual Activity   Alcohol use: Yes    Comment: OCC   Drug use: Not Currently    Types: Marijuana   Sexual activity: Yes  Other Topics Concern   Not on file  Social History Narrative   Not on file   Social Determinants of Health   Financial Resource Strain: Not on file  Food Insecurity: Not on file  Transportation Needs: Not on file  Physical Activity: Not on file  Stress: Not on file  Social Connections: Not on file  Intimate Partner Violence: Not on file    Current Outpatient Medications on File Prior to  Visit  Medication Sig Dispense Refill   acetaminophen (TYLENOL) 500 MG tablet Take 2 tablets (1,000 mg total) by mouth every 6 (six) hours as needed (for pain scale < 4).     coconut oil OIL Apply 1 application topically as needed.  0   ibuprofen (ADVIL) 600 MG tablet Take 1 tablet (600 mg total) by mouth every 6 (six) hours as needed. 30 tablet 0   No current facility-administered medications on file prior to visit.    No Known Allergies    Review of Systems Constitutional: No recent fever/chills/sweats Respiratory: No recent cough/bronchitis Cardiovascular: No chest pain Gastrointestinal: No recent nausea/vomiting/diarrhea Genitourinary: No UTI symptoms Hematologic/lymphatic:No history of coagulopathy or recent blood thinner use    Objective:   Blood pressure 117/87, pulse 94, resp. rate 16, height 5\' 6"  (1.676 m), weight 222 lb 4.8 oz (100.8 kg), last menstrual period 09/29/2022, not currently breastfeeding. CONSTITUTIONAL: Well-developed, well-nourished female in no acute distress.  HENT:  Normocephalic, atraumatic, External right and left ear normal. Oropharynx is clear and moist EYES: Conjunctivae and EOM are normal. Pupils are equal, round, and reactive to light. No scleral icterus.  NECK: Normal range of motion,  supple, no masses SKIN: Skin is warm and dry. No rash noted. Not diaphoretic. No erythema. No pallor. NEUROLOGIC: Alert and oriented to person, place, and time. Normal reflexes, muscle tone coordination. No cranial nerve deficit noted. PSYCHIATRIC: Normal mood and affect. Normal behavior. Normal judgment and thought content. CARDIOVASCULAR: Normal heart rate noted, regular rhythm RESPIRATORY: Effort and breath sounds normal, no problems with respiration noted ABDOMEN: Soft, nontender, nondistended. PELVIC: Deferred MUSCULOSKELETAL: Normal range of motion. No edema and no tenderness. 2+ distal pulses.    Labs: No results found for this or any previous visit  (from the past 336 hour(s)).   Imaging Studies: No results found.  Assessment:    Undesired fertility  HTN   Plan:   Counseling: Procedure, risks, reasons, benefits and complications (including injury to bowel, bladder, major blood vessel, ureter, bleeding, possibility of transfusion, infection, or fistula formation) reviewed in detail. Likelihood of success in alleviating the patient's condition was discussed. Routine postoperative instructions will be reviewed with the patient and her family in detail after surgery.  The patient concurred with the proposed plan, giving informed written consent for the surgery.   Preop testing ordered. Instructions reviewed, including NPO after midnight. HTN well controlled.      Hildred Laser, MD [facility] at [facility]

## 2022-10-27 NOTE — Telephone Encounter (Signed)
Patient is scheduled for 2/13 with Dr. Marcelline Mates. Patient scheduled on 10/26/22

## 2022-11-12 ENCOUNTER — Inpatient Hospital Stay
Admission: RE | Admit: 2022-11-12 | Discharge: 2022-11-12 | Disposition: A | Discharge status: Home / Self Care | Payer: Medicaid Other | Source: Ambulatory Visit

## 2022-11-12 NOTE — Patient Instructions (Signed)
Your procedure is scheduled on: Monday November 22, 2022. Report to Day Surgery inside [facility] 2nd floor, stop by registration desk before getting on elevator. To find out your arrival time please call [phone] between 1PM - 3PM on Friday November 19, 2022.  Remember: Instructions that are not followed completely may result in serious medical risk,  up to and including death, or upon the discretion of your surgeon and anesthesiologist your  surgery may need to be rescheduled.     _X__ 1. Do not eat food or drink fluids after midnight the night before your procedure.                 No chewing gum or hard candies.  __X__2.  On the morning of surgery brush your teeth with toothpaste and water, you                may rinse your mouth with mouthwash if you wish.  Do not swallow any toothpaste or mouthwash.     _X__ 3.  No Alcohol for 24 hours before or after surgery.   _X__ 4.  Do Not Smoke or use e-cigarettes For 24 Hours Prior to Your Surgery.                 Do not use any chewable tobacco products for at least 6 hours prior to                 Surgery.  _X__  5.  Do not use any recreational drugs (marijuana, cocaine, heroin, ecstasy, MDMA or other)                For at least one week prior to your surgery.  Combination of these drugs with anesthesia                May have life threatening results.  ____  6.  Bring all medications with you on the day of surgery if instructed.   __X__  7.  Notify your doctor if there is any change in your medical condition      (cold, fever, infections).     Do not wear jewelry, make-up, hairpins, clips or nail polish. Do not wear lotions, powders, or perfumes. You may wear deodorant. Do not shave 48 hours prior to surgery. Men may shave face and neck. Do not bring valuables to the hospital.    [facility] is not responsible for any belongings or valuables.  Contacts, dentures or bridgework may not be worn into  surgery. Leave your suitcase in the car. After surgery it may be brought to your room. For patients admitted to the hospital, discharge time is determined by your treatment team.   Patients discharged the day of surgery will not be allowed to drive home.   Make arrangements for someone to be with you for the first 24 hours of your Same Day Discharge.   __X__ Take these medicines the morning of surgery with A SIP OF WATER:    1. None   2.   3.   4.  5.  6.  ____ Fleet Enema (as directed)   __X__ Use CHG Soap (or wipes) as directed  ____ Use Benzoyl Peroxide Gel as instructed  ____ Use inhalers on the day of surgery  ____ Stop metformin 2 days prior to surgery    ____ Take 1/2 of usual insulin dose the night before surgery. No insulin the morning  of surgery.   ____ Call your PCP, cardiologist, or Pulmonologist if taking Coumadin/Plavix/aspirin and ask when to stop before your surgery.   __X__ One Week prior to surgery- Stop Anti-inflammatories such as Ibuprofen, Aleve, Advil, Motrin, meloxicam (MOBIC), diclofenac, etodolac, ketorolac, Toradol, Daypro, piroxicam, Goody's or BC powders. OK TO USE TYLENOL IF NEEDED   __X__ One week prior to surgery stop ALL vitamins and or supplements until after surgery.    ____ Bring C-Pap to the hospital.    If you have any questions regarding your pre-procedure instructions,  Please call Pre-admit Testing at [phone]    Preparing for Surgery with CHLORHEXIDINE GLUCONATE (CHG) Soap  Chlorhexidine Gluconate (CHG) Soap  o An antiseptic cleaner that kills germs and bonds with the skin to continue killing germs even after washing  o Used for showering the night before surgery and morning of surgery  Before surgery, you can play an important role by reducing the number of germs on your skin.  CHG (Chlorhexidine gluconate) soap is an antiseptic cleanser which kills germs and bonds with the skin to continue killing germs  even after washing.  Please do not use if you have an allergy to CHG or antibacterial soaps. If your skin becomes reddened/irritated stop using the CHG.  1. Shower the NIGHT BEFORE SURGERY and the MORNING OF SURGERY with CHG soap.  2. If you choose to wash your hair, wash your hair first as usual with your normal shampoo.  3. After shampooing, rinse your hair and body thoroughly to remove the shampoo.  4. Use CHG as you would any other liquid soap. You can apply CHG directly to the skin and wash gently with a scrungie or a clean washcloth.  5. Apply the CHG soap to your body only from the neck down. Do not use on open wounds or open sores. Avoid contact with your eyes, ears, mouth, and genitals (private parts). Wash face and genitals (private parts) with your normal soap.  6. Wash thoroughly, paying special attention to the area where your surgery will be performed.  7. Thoroughly rinse your body with warm water.  8. Do not shower/wash with your normal soap after using and rinsing off the CHG soap.  9. Pat yourself dry with a clean towel.  10. Wear clean pajamas to bed the night before surgery.  12. Place clean sheets on your bed the night of your first shower and do not sleep with pets.  13. Shower again with the CHG soap on the day of surgery prior to arriving at the hospital.  14. Do not apply any deodorants/lotions/powders.  15. Please wear clean clothes to the hospital.

## 2022-11-16 ENCOUNTER — Inpatient Hospital Stay: Admission: RE | Admit: 2022-11-16 | Payer: Medicaid Other | Source: Ambulatory Visit

## 2022-11-16 NOTE — Pre-Procedure Instructions (Signed)
Multiple calls on 2 separate days to patient in attempt to complete the pre-op interview. Messages left, no return call back. If patient is still wanting surgery on January 29, will need to arrive 2 hours early on the day of surgery to complete required testing. Dr. Marcelline Mates office personnel notified.

## 2022-11-17 ENCOUNTER — Telehealth: Payer: Self-pay | Admitting: Obstetrics and Gynecology

## 2022-11-17 NOTE — Telephone Encounter (Signed)
Reached out to patient to inform her of her missed pre admit appointment and that she will need to arrive at the hospital 2 hours prior than her scheduled surgery time. No answer, Phone goes straight to voicemail. VM is now full.

## 2022-11-22 ENCOUNTER — Encounter: Admission: RE | Payer: Self-pay | Source: Home / Self Care

## 2022-11-22 ENCOUNTER — Ambulatory Visit
Admission: RE | Admit: 2022-11-22 | Payer: Medicaid Other | Source: Home / Self Care | Admitting: Obstetrics and Gynecology

## 2022-11-22 ENCOUNTER — Ambulatory Visit: Payer: Medicaid Other

## 2022-11-22 SURGERY — SALPINGECTOMY, BILATERAL, LAPAROSCOPIC
Anesthesia: General | Laterality: Bilateral

## 2022-11-22 NOTE — Progress Notes (Deleted)
    NURSE VISIT NOTE  Subjective:    Patient ID: Jodi Perez, female    DOB: 1991/06/03, 32 y.o.   MRN: [id number]  HPI  Patient is a 32 y.o. [id number] female who presents for pregnancy confirmation. UPT resulted as ****. LMP ***. (*** if positive). Pregnancy is desired/undesired. Pregnancy was planned/unplanned. Sexual Activity:      Contraception:  Current symptoms also include: Positive home pregnancy test. Last menstrual period was normal/abnormal.  She has been advised to schedule a NOB Nurse Interview at checkout.   Patient states no other questions or concerns.   {Common ambulatory SmartLinks:19316}  Review of Systems {ros; complete:30496}   Objective:   not currently breastfeeding. There is no height or weight on file to calculate BMI.  General appearance: {general exam:16600}  Assessment:   1. Amenorrhea      Plan:   There are no diagnoses linked to this encounter.     Cristy Folks, [place]

## 2022-11-22 NOTE — Patient Instructions (Incomplete)
Commonly Asked Questions During Pregnancy  Cats: A parasite can be excreted in cat feces.  To avoid exposure you need to have another person empty the little box.  If you must empty the litter box you will need to wear gloves.  Wash your hands after handling your cat.  This parasite can also be found in raw or undercooked meat so this should also be avoided.  Colds, Sore Throats, Flu: Please check your medication sheet to see what you can take for symptoms.  If your symptoms are unrelieved by these medications please call the office.  Dental Work: Most any dental work Agricultural consultant recommends is permitted.  X-rays should only be taken during the first trimester if absolutely necessary.  Your abdomen should be shielded with a lead apron during all x-rays.  Please notify your provider prior to receiving any x-rays.  Novocaine is fine; gas is not recommended.  If your dentist requires a note from [place] prior to dental work please call the office and we will provide one for you.  Exercise: Exercise is an important part of staying healthy during your pregnancy.  You may continue most exercises you were accustomed to prior to pregnancy.  Later in your pregnancy you will most likely notice you have difficulty with activities requiring balance like riding a bicycle.  It is important that you listen to your body and avoid activities that put you at a higher risk of falling.  Adequate rest and staying well hydrated are a must!  If you have questions about the safety of specific activities ask your provider.    Exposure to Children with illness: Try to avoid obvious exposure; report any symptoms to [place] when noted,  If you have chicken pos, red measles or mumps, you should be immune to these diseases.   Please do not take any vaccines while pregnant unless you have checked with your OB provider.  Fetal Movement: After 28 weeks we recommend you do "kick counts" twice daily.  Lie or sit down in a calm quiet  environment and count your baby movements "kicks".  You should feel your baby at least 10 times per hour.  If you have not felt 10 kicks within the first hour get up, walk around and have something sweet to eat or drink then repeat for an additional hour.  If count remains less than 10 per hour notify your provider.  Fumigating: Follow your pest control agent's advice as to how long to stay out of your home.  Ventilate the area well before re-entering.  Hemorrhoids:   Most over-the-counter preparations can be used during pregnancy.  Check your medication to see what is safe to use.  It is important to use a stool softener or fiber in your diet and to drink lots of liquids.  If hemorrhoids seem to be getting worse please call the office.   Hot Tubs:  Hot tubs Jacuzzis and saunas are not recommended while pregnant.  These increase your internal body temperature and should be avoided.  Intercourse:  Sexual intercourse is safe during pregnancy as long as you are comfortable, unless otherwise advised by your provider.  Spotting may occur after intercourse; report any bright red bleeding that is heavier than spotting.  Labor:  If you know that you are in labor, please go to the hospital.  If you are unsure, please call the office and let us help you decide what to do.  Lifting, straining, etc:  If your job  requires heavy lifting or straining please check with your provider for any limitations.  Generally, you should not lift items heavier than that you can lift simply with your hands and arms (no back muscles)  Painting:  Paint fumes do not harm your pregnancy, but may make you ill and should be avoided if possible.  Latex or water based paints have less odor than oils.  Use adequate ventilation while painting.  Permanents & Hair Color:  Chemicals in hair dyes are not recommended as they cause increase hair dryness which can increase hair loss during pregnancy.  " Highlighting" and permanents are allowed.   Dye may be absorbed differently and permanents may not hold as well during pregnancy.   Common Medications Safe in Pregnancy  Acne:      Constipation:  Benzoyl Peroxide     Colace  Clindamycin      Dulcolax Suppository  Topica Erythromycin     Fibercon  Salicylic Acid      Metamucil         Miralax AVOID:        Senakot   Accutane    Cough:  Retin-A       Cough Drops  Tetracycline      Phenergan w/ Codeine if Rx  Minocycline      Robitussin (Plain & DM)  Antibiotics:     Crabs/Lice:  Ceclor       RID  Cephalosporins    AVOID:  E-Mycins      Kwell  Keflex  Macrobid/Macrodantin   Diarrhea:  Penicillin      Kao-Pectate  Zithromax      Imodium AD         PUSH FLUIDS AVOID:       Cipro     Fever:  Tetracycline      Tylenol (Regular or Extra  Minocycline       Strength)  Levaquin      Extra Strength-Do not          Exceed 8 tabs/24 hrs Caffeine:        200mg /day (equiv. To 1 cup of coffee or  approx. 3 12 oz sodas)         Gas: Cold/Hayfever:       Gas-X  Benadryl      Mylicon  Claritin       Phazyme  **Claritin-D        Chlor-Trimeton    Headaches:  Dimetapp      ASA-Free Excedrin  Drixoral-Non-Drowsy     Cold Compress  Mucinex (Guaifenasin)     Tylenol (Regular or Extra  Sudafed/Sudafed-12 Hour     Strength)  **Sudafed PE Pseudoephedrine   Tylenol Cold & Sinus     Vicks Vapor Rub  Zyrtec  **AVOID if Problems With Blood Pressure         Heartburn: Avoid lying down for at least 1 hour after meals  Aciphex      Maalox     Rash:  Milk of Magnesia     Benadryl    Mylanta       1% Hydrocortisone Cream  Pepcid  Pepcid Complete   Sleep Aids:  Prevacid      Ambien   Prilosec       Benadryl  Rolaids       Chamomile Tea  Tums (Limit 4/day)     Unisom         Tylenol PM         Warm  milk-add vanilla or  Hemorrhoids:       Sugar for taste  Anusol/Anusol H.C.  (RX: Analapram 2.5%)  Sugar Substitutes:  Hydrocortisone OTC     Ok in moderation  Preparation  H      Tucks        Vaseline lotion applied to tissue with wiping    Herpes:     Throat:  Acyclovir      Oragel  Famvir  Valtrex     Vaccines:         Flu Shot Leg Cramps:       *Gardasil  Benadryl      Hepatitis A         Hepatitis B Nasal Spray:       Pneumovax  Saline Nasal Spray     Polio Booster         Tetanus Nausea:       Tuberculosis test or PPD  Vitamin B6 25 mg TID   AVOID:    Dramamine      *Gardasil  Emetrol       Live Poliovirus  Ginger Root 250 mg QID    MMR (measles, mumps &  High Complex Carbs @ Bedtime    rebella)  Sea Bands-Accupressure    Varicella (Chickenpox)  Unisom 1/2 tab TID     *No known complications           If received before Pain:         Known pregnancy;   Darvocet       Resume series after  Lortab        Delivery  Percocet    Yeast:   Tramadol      Femstat  Tylenol 3      Gyne-lotrimin  Ultram       Monistat  Vicodin           MISC:         All Sunscreens           Hair Coloring/highlights          Insect Repellant's          (Including DEET)         Mystic Tans Breast Self-Awareness Breast self-awareness is knowing how your breasts look and feel. You need to: Check your breasts on a regular basis. Tell your doctor about any changes. Become familiar with the look and feel of your breasts. This can help you catch a breast problem while it is still small and can be treated. You should do breast self-exams even if you have breast implants. What you need: A mirror. A well-lit room. A pillow or other soft object. How to do a breast self-exam Follow these steps to do a breast self-exam: Look for changes  Take off all the clothes above your waist. Stand in front of a mirror in a room with good lighting. Put your hands down at your sides. Compare your breasts in the mirror. Look for any difference between them, such as: A difference in shape. A difference in size. Wrinkles, dips, and bumps in one breast and not the other. Look at each  breast for changes in the skin, such as: Redness. Scaly areas. Skin that has gotten thicker. Dimpling. Open sores (ulcers). Look for changes in your nipples, such as: Fluid coming out of a nipple. Fluid around a nipple. Bleeding. Dimpling. Redness. A nipple that looks pushed in (retracted), or that has changed position. Feel for changes Lie on your  back. Feel each breast. To do this: Pick a breast to feel. Place a pillow under the shoulder closest to that breast. Put the arm closest to that breast behind your head. Feel the nipple area of that breast using the hand of your other arm. Feel the area with the pads of your three middle fingers by making small circles with your fingers. Use light, medium, and firm pressure. Continue the overlapping circles, moving downward over the breast. Keep making circles with your fingers. Stop when you feel your ribs. Start making circles with your fingers again, this time going upward until you reach your collarbone. Then, make circles outward across your breast and into your armpit area. Squeeze your nipple. Check for discharge and lumps. Repeat these steps to check your other breast. Sit or stand in the tub or shower. With soapy water on your skin, feel each breast the same way you did when you were lying down. Write down what you find Writing down what you find can help you remember what to tell your doctor. Write down: What is normal for each breast. Any changes you find in each breast. These include: The kind of changes you find. A tender or painful breast. Any lump you find. Write down its size and where it is. When you last had your monthly period (menstrual cycle). General tips If you are breastfeeding, the best time to check your breasts is after you feed your baby or after you use a breast pump. If you get monthly bleeding, the best time to check your breasts is 5-7 days after your monthly cycle ends. With time, you will become  comfortable with the self-exam. You will also start to know if there are changes in your breasts. Contact a doctor if: You see a change in the shape or size of your breasts or nipples. You see a change in the skin of your breast or nipples, such as red or scaly skin. You have fluid coming from your nipples that is not normal. You find a new lump or thick area. You have breast pain. You have any concerns about your breast health. Summary Breast self-awareness includes looking for changes in your breasts and feeling for changes within your breasts. You should do breast self-awareness in front of a mirror in a well-lit room. If you get monthly periods (menstrual cycles), the best time to check your breasts is 5-7 days after your period ends. Tell your doctor about any changes you see in your breasts. Changes include changes in size, changes on the skin, painful or tender breasts, or fluid from your nipples that is not normal. This information is not intended to replace advice given to you by your health care provider. Make sure you discuss any questions you have with your health care provider. Document Revised: 03/18/2022 Document Reviewed: 08/13/2021 Elsevier Patient Education  [place]. Morning Sickness  Morning sickness is when you feel like you may vomit (feel nauseous) during pregnancy. Sometimes, you may vomit. Morning sickness most often happens in the morning, but it can also happen at any time of the day. Some women may have morning sickness that makes them vomit all the time. This is a more serious problem that needs treatment. What are the causes? The cause of this condition is not known. What increases the risk? You had vomiting or a feeling like you may vomit before your pregnancy. You had morning sickness in another pregnancy. You are pregnant with more than one baby, such  as twins. What are the signs or symptoms? Feeling like you may vomit. Vomiting. How is this  treated? Treatment is usually not needed for this condition. You may only need to change what you eat. In some cases, your doctor may give you some things to take for your condition. These include: Vitamin B6 supplements. Medicines to treat the feeling that you may vomit. Ginger. Follow these instructions at home: Medicines Take over-the-counter and prescription medicines only as told by your doctor. Do not take any medicines until you talk with your doctor about them first. Take multivitamins before you get pregnant. These can stop or lessen the symptoms of morning sickness. Eating and drinking Eat dry toast or crackers before getting out of bed. Eat 5 or 6 small meals a day. Eat dry and bland foods like rice and baked potatoes. Do not eat greasy, fatty, or spicy foods. Have someone cook for you if the smell of food causes you to vomit or to feel like you may vomit. If you feel like you may vomit after taking prenatal vitamins, take them at night or with a snack. Eat protein foods when you need a snack. Nuts, yogurt, and cheese are good choices. Drink fluids throughout the day. Try ginger ale made with real ginger, ginger tea made from fresh grated ginger, or ginger candies. General instructions Do not smoke or use any products that contain nicotine or tobacco. If you need help quitting, ask your doctor. Use an air purifier to keep the air in your house free of smells. Get lots of fresh air. Try to avoid smells that make you feel sick. Try wearing an acupressure wristband. This is a wristband that is used to treat seasickness. Try a treatment called acupuncture. In this treatment, a doctor puts needles into certain areas of your body to make you feel better. Contact a doctor if: You need medicine to feel better. You feel dizzy or light-headed. You are losing weight. Get help right away if: The feeling that you may vomit will not go away, or you cannot stop vomiting. You faint. You  have very bad pain in your belly. Summary Morning sickness is when you feel like you may vomit (feel nauseous) during pregnancy. You may feel sick in the morning, but you can feel this way at any time of the day. Making some changes to what you eat may help your symptoms go away. This information is not intended to replace advice given to you by your health care provider. Make sure you discuss any questions you have with your health care provider. Document Revised: 05/26/2020 Document Reviewed: 05/05/2020 Elsevier Patient Education  2023 [employer]. First Trimester of Pregnancy  The first trimester of pregnancy starts on the first day of your last menstrual period until the end of week 12. This is also called months 1 through 3 of pregnancy. Body changes during your first trimester Your body goes through many changes during pregnancy. The changes usually return to normal after your baby is born. Physical changes You may gain or lose weight. Your breasts may grow larger and hurt. The area around your nipples may get darker. Dark spots or blotches may develop on your face. You may have changes in your hair. Health changes You may feel like you might vomit (nauseous), and you may vomit. You may have heartburn. You may have headaches. You may have trouble pooping (constipation). Your gums may bleed. Other changes You may get tired easily. You may pee (urinate) more  often. Your menstrual periods will stop. You may not feel hungry. You may want to eat certain kinds of food. You may have changes in your emotions from day to day. You may have more dreams. Follow these instructions at home: Medicines Take over-the-counter and prescription medicines only as told by your doctor. Some medicines are not safe during pregnancy. Take a prenatal vitamin that contains at least 600 micrograms (mcg) of folic acid. Eating and drinking Eat healthy meals that include: Fresh fruits and  vegetables. Whole grains. Good sources of protein, such as meat, eggs, or tofu. Low-fat dairy products. Avoid raw meat and unpasteurized juice, milk, and cheese. If you feel like you may vomit, or you vomit: Eat 4 or 5 small meals a day instead of 3 large meals. Try eating a few soda crackers. Drink liquids between meals instead of during meals. You may need to take these actions to prevent or treat trouble pooping: Drink enough fluids to keep your pee (urine) pale yellow. Eat foods that are high in fiber. These include beans, whole grains, and fresh fruits and vegetables. Limit foods that are high in fat and sugar. These include fried or sweet foods. Activity Exercise only as told by your doctor. Most people can do their usual exercise routine during pregnancy. Stop exercising if you have cramps or pain in your lower belly (abdomen) or low back. Do not exercise if it is too hot or too humid, or if you are in a place of great height (high altitude). Avoid heavy lifting. If you choose to, you may have sex unless your doctor tells you not to. Relieving pain and discomfort Wear a good support bra if your breasts are sore. Rest with your legs raised (elevated) if you have leg cramps or low back pain. If you have bulging veins (varicose veins) in your legs: Wear support hose as told by your doctor. Raise your feet for 15 minutes, 3-4 times a day. Limit salt in your food. Safety Wear your seat belt at all times when you are in a car. Talk with your doctor if someone is hurting you or yelling at you. Talk with your doctor if you are feeling sad or have thoughts of hurting yourself. Lifestyle Do not use hot tubs, steam rooms, or saunas. Do not douche. Do not use tampons or scented sanitary pads. Do not use herbal medicines, illegal drugs, or medicines that are not approved by your doctor. Do not drink alcohol. Do not smoke or use any products that contain nicotine or tobacco. If you need  help quitting, ask your doctor. Avoid cat litter boxes and soil that is used by cats. These carry germs that can cause harm to the baby and can cause a loss of your baby by miscarriage or stillbirth. General instructions Keep all follow-up visits. This is important. Ask for help if you need counseling or if you need help with nutrition. Your doctor can give you advice or tell you where to go for help. Visit your dentist. At home, brush your teeth with a soft toothbrush. Floss gently. Write down your questions. Take them to your prenatal visits. Where to find more information American Pregnancy Association: americanpregnancy.org [employer]: www.acog.org Office on Women's Health: KeywordPortfolios.com.br Contact a doctor if: You are dizzy. You have a fever. You have mild cramps or pressure in your lower belly. You have a nagging pain in your belly area. You continue to feel like you may vomit, you vomit, or  you have watery poop (diarrhea) for 24 hours or longer. You have a bad-smelling fluid coming from your vagina. You have pain when you pee. You are exposed to a disease that spreads from person to person, such as chickenpox, measles, Zika virus, HIV, or hepatitis. Get help right away if: You have spotting or bleeding from your vagina. You have very bad belly cramping or pain. You have shortness of breath or chest pain. You have any kind of injury, such as from a fall or a car crash. You have new or increased pain, swelling, or redness in an arm or leg. Summary The first trimester of pregnancy starts on the first day of your last menstrual period until the end of week 12 (months 1 through 3). Eat 4 or 5 small meals a day instead of 3 large meals. Do not smoke or use any products that contain nicotine or tobacco. If you need help quitting, ask your doctor. Keep all follow-up visits. This information is not intended to replace advice given to you  by your health care provider. Make sure you discuss any questions you have with your health care provider. Document Revised: 03/19/2020 Document Reviewed: 01/24/2020 Elsevier Patient Education  2023 Elsevier Inc.   Sunbathing:  Use a sunscreen, as skin burns easily during pregnancy.  Drink plenty of fluids; avoid over heating.  Tanning Beds:  Because their possible side effects are still unknown, tanning beds are not recommended.  Ultrasound Scans:  Routine ultrasounds are performed at approximately 20 weeks.  You will be able to see your baby's general anatomy an if you would like to know the gender this can usually be determined as well.  If it is questionable when you conceived you may also receive an ultrasound early in your pregnancy for dating purposes.  Otherwise ultrasound exams are not routinely performed unless there is a medical necessity.  Although you can request a scan we ask that you pay for it when conducted because insurance does not cover " patient request" scans.  Work: If your pregnancy proceeds without complications you may work until your due date, unless your physician or employer advises otherwise.  Round Ligament Pain/Pelvic Discomfort:  Sharp, shooting pains not associated with bleeding are fairly common, usually occurring in the second trimester of pregnancy.  They tend to be worse when standing up or when you remain standing for long periods of time.  These are the result of pressure of certain pelvic ligaments called "round ligaments".  Rest, Tylenol and heat seem to be the most effective relief.  As the womb and fetus grow, they rise out of the pelvis and the discomfort improves.  Please notify the office if your pain seems different than that described.  It may represent a more serious condition.

## 2022-12-03 ENCOUNTER — Telehealth: Payer: Self-pay | Admitting: Obstetrics and Gynecology

## 2022-12-03 NOTE — Progress Notes (Deleted)
    OBSTETRICS/GYNECOLOGY POST-OPERATIVE CLINIC VISIT  Subjective:     Jodi Perez is a 32 y.o. female who presents to the clinic 2 weeks status post Joffre  for {gyn surg indications maj:13998}. Eating a regular diet {with-without:5700} difficulty. Bowel movements are {normal/abnormal***:19619}. {pain control:13522::"The patient is not having any pain."}  {Common ambulatory SmartLinks:19316}  Review of Systems {ros; complete:30496}   Objective:   There were no vitals taken for this visit. There is no height or weight on file to calculate BMI.  General:  alert and no distress  Abdomen: soft, bowel sounds active, non-tender  Incision:   {incision:13716::"no dehiscence","incision well approximated","healing well","no drainage","no erythema","no hernia","no seroma","no swelling"}    Pathology:    Assessment:   Patient s/p *** (surgery)  {doing well:13525::"Doing well postoperatively."}   Plan:   1. Continue any current medications as instructed by provider. 2. Wound care discussed. 3. Operative findings again reviewed. Pathology report discussed. 4. Activity restrictions: {restrictions:13723} 5. Anticipated return to work: {work return:14002}. 6. Follow up: {1-10:13787} {time; units:18646} for ***    Chilton Greathouse, [facility]

## 2022-12-03 NOTE — Telephone Encounter (Signed)
Called patient to let her know that I switched her appt type to gyn.So that she can discuss the BTL.

## 2022-12-03 NOTE — Progress Notes (Deleted)
    OBSTETRICS/GYNECOLOGY POST-OPERATIVE CLINIC VISIT  Subjective:     Jodi Perez is a 31 y.o. female who presents to the clinic 2 weeks status post LAPAROSCOPIC BILATERAL TUBAL LIGATION  for {gyn surg indications maj:13998}. Eating a regular diet {with-without:5700} difficulty. Bowel movements are {normal/abnormal***:19619}. {pain control:13522::"The patient is not having any pain."}  {Common ambulatory SmartLinks:19316}  Review of Systems {ros; complete:30496}   Objective:   There were no vitals taken for this visit. There is no height or weight on file to calculate BMI.  General:  alert and no distress  Abdomen: soft, bowel sounds active, non-tender  Incision:   {incision:13716::"no dehiscence","incision well approximated","healing well","no drainage","no erythema","no hernia","no seroma","no swelling"}    Pathology:    Assessment:   Patient s/p *** (surgery)  {doing well:13525::"Doing well postoperatively."}   Plan:   1. Continue any current medications as instructed by provider. 2. Wound care discussed. 3. Operative findings again reviewed. Pathology report discussed. 4. Activity restrictions: {restrictions:13723} 5. Anticipated return to work: {work return:14002}. 6. Follow up: {1-10:13787} {time; units:18646} for ***    Trayton Szabo L, CMA [facility]  

## 2022-12-07 ENCOUNTER — Ambulatory Visit: Payer: Self-pay | Admitting: Obstetrics and Gynecology
